# Patient Record
Sex: Male | Born: 1935 | Race: Black or African American | Hispanic: No | Marital: Married | Smoking: Former smoker
Health system: Southern US, Community
[De-identification: ages and names within clinical notes are randomized; demographics above are authoritative.]

## PROBLEM LIST (undated history)

## (undated) DIAGNOSIS — I10 Essential (primary) hypertension: Secondary | ICD-10-CM

## (undated) DIAGNOSIS — Z8719 Personal history of other diseases of the digestive system: Secondary | ICD-10-CM

## (undated) DIAGNOSIS — K219 Gastro-esophageal reflux disease without esophagitis: Secondary | ICD-10-CM

## (undated) DIAGNOSIS — F0390 Unspecified dementia without behavioral disturbance: Secondary | ICD-10-CM

## (undated) DIAGNOSIS — F0280 Dementia in other diseases classified elsewhere without behavioral disturbance: Secondary | ICD-10-CM

## (undated) DIAGNOSIS — E785 Hyperlipidemia, unspecified: Secondary | ICD-10-CM

## (undated) DIAGNOSIS — G309 Alzheimer's disease, unspecified: Secondary | ICD-10-CM

## (undated) DIAGNOSIS — I2510 Atherosclerotic heart disease of native coronary artery without angina pectoris: Secondary | ICD-10-CM

## (undated) HISTORY — DX: Hyperlipidemia, unspecified: E78.5

## (undated) HISTORY — DX: Personal history of other diseases of the digestive system: Z87.19

## (undated) HISTORY — PX: CHOLECYSTECTOMY: SHX55

## (undated) HISTORY — DX: Unspecified dementia, unspecified severity, without behavioral disturbance, psychotic disturbance, mood disturbance, and anxiety: F03.90

## (undated) HISTORY — PX: BACK SURGERY: SHX140

## (undated) HISTORY — DX: Essential (primary) hypertension: I10

## (undated) HISTORY — DX: Atherosclerotic heart disease of native coronary artery without angina pectoris: I25.10

## (undated) HISTORY — DX: Gastro-esophageal reflux disease without esophagitis: K21.9

---

## 1998-12-07 ENCOUNTER — Emergency Department (HOSPITAL_COMMUNITY): Admission: EM | Admit: 1998-12-07 | Discharge: 1998-12-07 | Payer: Self-pay | Admitting: Emergency Medicine

## 1998-12-08 ENCOUNTER — Encounter: Payer: Self-pay | Admitting: Emergency Medicine

## 1999-03-24 ENCOUNTER — Encounter: Payer: Self-pay | Admitting: General Surgery

## 1999-03-26 ENCOUNTER — Encounter: Payer: Self-pay | Admitting: General Surgery

## 1999-03-26 ENCOUNTER — Ambulatory Visit (HOSPITAL_COMMUNITY): Admission: RE | Admit: 1999-03-26 | Discharge: 1999-03-27 | Payer: Self-pay | Admitting: General Surgery

## 1999-12-11 ENCOUNTER — Ambulatory Visit (HOSPITAL_COMMUNITY): Admission: RE | Admit: 1999-12-11 | Discharge: 1999-12-11 | Payer: Self-pay | Admitting: Unknown Physician Specialty

## 2001-05-01 ENCOUNTER — Encounter: Admission: RE | Admit: 2001-05-01 | Discharge: 2001-07-30 | Payer: Self-pay | Admitting: Orthopedic Surgery

## 2001-05-09 ENCOUNTER — Emergency Department (HOSPITAL_COMMUNITY): Admission: EM | Admit: 2001-05-09 | Discharge: 2001-05-09 | Payer: Self-pay | Admitting: Emergency Medicine

## 2001-05-10 ENCOUNTER — Encounter: Payer: Self-pay | Admitting: Emergency Medicine

## 2001-05-10 ENCOUNTER — Ambulatory Visit (HOSPITAL_COMMUNITY): Admission: RE | Admit: 2001-05-10 | Discharge: 2001-05-10 | Payer: Self-pay | Admitting: Emergency Medicine

## 2001-05-22 ENCOUNTER — Encounter: Payer: Self-pay | Admitting: Neurosurgery

## 2001-05-23 ENCOUNTER — Inpatient Hospital Stay (HOSPITAL_COMMUNITY): Admission: RE | Admit: 2001-05-23 | Discharge: 2001-05-24 | Payer: Self-pay | Admitting: Neurosurgery

## 2001-05-23 ENCOUNTER — Encounter: Payer: Self-pay | Admitting: Neurosurgery

## 2002-01-15 ENCOUNTER — Ambulatory Visit (HOSPITAL_COMMUNITY): Admission: RE | Admit: 2002-01-15 | Discharge: 2002-01-15 | Payer: Self-pay | Admitting: Neurosurgery

## 2002-01-15 ENCOUNTER — Encounter: Payer: Self-pay | Admitting: Neurosurgery

## 2004-01-29 ENCOUNTER — Ambulatory Visit (HOSPITAL_COMMUNITY): Admission: RE | Admit: 2004-01-29 | Discharge: 2004-01-29 | Payer: Self-pay | Admitting: Internal Medicine

## 2004-12-17 ENCOUNTER — Encounter (INDEPENDENT_AMBULATORY_CARE_PROVIDER_SITE_OTHER): Payer: Self-pay | Admitting: Specialist

## 2004-12-17 ENCOUNTER — Ambulatory Visit (HOSPITAL_COMMUNITY): Admission: RE | Admit: 2004-12-17 | Discharge: 2004-12-17 | Payer: Self-pay | Admitting: Gastroenterology

## 2005-03-30 ENCOUNTER — Encounter: Admission: RE | Admit: 2005-03-30 | Discharge: 2005-03-30 | Payer: Self-pay | Admitting: Internal Medicine

## 2006-06-02 ENCOUNTER — Encounter: Admission: RE | Admit: 2006-06-02 | Discharge: 2006-06-02 | Payer: Self-pay | Admitting: Cardiovascular Disease

## 2006-06-06 ENCOUNTER — Encounter: Admission: RE | Admit: 2006-06-06 | Discharge: 2006-06-06 | Payer: Self-pay | Admitting: Cardiovascular Disease

## 2006-06-07 ENCOUNTER — Observation Stay (HOSPITAL_COMMUNITY): Admission: RE | Admit: 2006-06-07 | Discharge: 2006-06-08 | Payer: Self-pay | Admitting: Cardiovascular Disease

## 2006-07-29 ENCOUNTER — Emergency Department (HOSPITAL_COMMUNITY): Admission: EM | Admit: 2006-07-29 | Discharge: 2006-07-29 | Payer: Self-pay | Admitting: Emergency Medicine

## 2006-08-09 ENCOUNTER — Inpatient Hospital Stay (HOSPITAL_COMMUNITY): Admission: EM | Admit: 2006-08-09 | Discharge: 2006-08-11 | Payer: Self-pay | Admitting: Emergency Medicine

## 2008-08-13 ENCOUNTER — Ambulatory Visit: Payer: Self-pay | Admitting: Diagnostic Radiology

## 2008-08-13 ENCOUNTER — Inpatient Hospital Stay (HOSPITAL_COMMUNITY): Admission: EM | Admit: 2008-08-13 | Discharge: 2008-08-19 | Payer: Self-pay | Admitting: Internal Medicine

## 2008-08-13 ENCOUNTER — Encounter: Payer: Self-pay | Admitting: Emergency Medicine

## 2009-01-11 ENCOUNTER — Encounter: Payer: Self-pay | Admitting: Internal Medicine

## 2009-01-11 ENCOUNTER — Emergency Department (HOSPITAL_COMMUNITY): Admission: EM | Admit: 2009-01-11 | Discharge: 2009-01-11 | Payer: Self-pay | Admitting: Emergency Medicine

## 2009-02-25 ENCOUNTER — Encounter: Admission: RE | Admit: 2009-02-25 | Discharge: 2009-02-25 | Payer: Self-pay | Admitting: Gastroenterology

## 2009-06-05 ENCOUNTER — Encounter: Payer: Self-pay | Admitting: Internal Medicine

## 2009-06-09 ENCOUNTER — Ambulatory Visit: Payer: Self-pay | Admitting: Internal Medicine

## 2009-06-09 DIAGNOSIS — K219 Gastro-esophageal reflux disease without esophagitis: Secondary | ICD-10-CM

## 2009-06-09 DIAGNOSIS — I10 Essential (primary) hypertension: Secondary | ICD-10-CM | POA: Insufficient documentation

## 2009-06-09 DIAGNOSIS — E785 Hyperlipidemia, unspecified: Secondary | ICD-10-CM | POA: Insufficient documentation

## 2009-06-09 DIAGNOSIS — I2510 Atherosclerotic heart disease of native coronary artery without angina pectoris: Secondary | ICD-10-CM | POA: Insufficient documentation

## 2009-06-09 DIAGNOSIS — K922 Gastrointestinal hemorrhage, unspecified: Secondary | ICD-10-CM | POA: Insufficient documentation

## 2009-06-09 DIAGNOSIS — G4700 Insomnia, unspecified: Secondary | ICD-10-CM

## 2009-06-09 LAB — CONVERTED CEMR LAB
HCT: 46.3 % (ref 39.0–52.0)
Hemoglobin: 15.5 g/dL (ref 13.0–17.0)
MCHC: 33.5 g/dL (ref 30.0–36.0)
MCV: 89.9 fL (ref 78.0–100.0)
Platelets: 245 10*3/uL (ref 150–400)
RBC: 5.15 M/uL (ref 4.22–5.81)
RDW: 13.1 % (ref 11.5–15.5)
TSH: 1.949 microintl units/mL (ref 0.350–4.500)
Vitamin B-12: 292 pg/mL (ref 211–911)
WBC: 6.3 10*3/uL (ref 4.0–10.5)

## 2009-06-10 ENCOUNTER — Encounter: Payer: Self-pay | Admitting: Internal Medicine

## 2009-08-15 ENCOUNTER — Ambulatory Visit: Payer: Self-pay | Admitting: Internal Medicine

## 2009-08-15 DIAGNOSIS — M25569 Pain in unspecified knee: Secondary | ICD-10-CM

## 2009-12-02 ENCOUNTER — Telehealth: Payer: Self-pay | Admitting: Internal Medicine

## 2010-02-10 ENCOUNTER — Encounter: Payer: Self-pay | Admitting: Internal Medicine

## 2010-02-10 LAB — CONVERTED CEMR LAB: Vitamin B-12: 528 pg/mL (ref 211–911)

## 2010-02-16 ENCOUNTER — Ambulatory Visit: Payer: Self-pay | Admitting: Diagnostic Radiology

## 2010-02-16 ENCOUNTER — Ambulatory Visit: Payer: Self-pay | Admitting: Internal Medicine

## 2010-02-16 ENCOUNTER — Ambulatory Visit (HOSPITAL_BASED_OUTPATIENT_CLINIC_OR_DEPARTMENT_OTHER): Admission: RE | Admit: 2010-02-16 | Discharge: 2010-02-16 | Payer: Self-pay | Admitting: Internal Medicine

## 2010-02-16 ENCOUNTER — Telehealth: Payer: Self-pay | Admitting: Internal Medicine

## 2010-02-16 DIAGNOSIS — R0789 Other chest pain: Secondary | ICD-10-CM | POA: Insufficient documentation

## 2010-04-13 ENCOUNTER — Telehealth: Payer: Self-pay | Admitting: Internal Medicine

## 2010-04-20 ENCOUNTER — Ambulatory Visit: Payer: Self-pay | Admitting: Internal Medicine

## 2010-07-01 ENCOUNTER — Encounter: Payer: Self-pay | Admitting: Internal Medicine

## 2010-07-07 ENCOUNTER — Telehealth: Payer: Self-pay | Admitting: Internal Medicine

## 2010-07-16 ENCOUNTER — Ambulatory Visit
Admission: RE | Admit: 2010-07-16 | Discharge: 2010-07-16 | Payer: Self-pay | Source: Home / Self Care | Attending: Internal Medicine | Admitting: Internal Medicine

## 2010-07-16 DIAGNOSIS — F4320 Adjustment disorder, unspecified: Secondary | ICD-10-CM | POA: Insufficient documentation

## 2010-07-23 ENCOUNTER — Encounter: Payer: Self-pay | Admitting: Internal Medicine

## 2010-07-28 NOTE — Assessment & Plan Note (Signed)
Summary: 2 MONTH FU/DT   Vital Signs:  Patient profile:   77 year old male Height:      69 inches Weight:      151 pounds BMI:     22.38 O2 Sat:      98 % on Room air Temp:     98.0 degrees F oral Pulse rate:   68 / minute Pulse rhythm:   regular Resp:     18 per minute BP sitting:   110 / 60  (right arm) Cuff size:   regular  Vitals Entered By: Glendell Docker CMA (April 20, 2010 3:59 PM)  O2 Flow:  Room air CC: 2 month follow up Is Patient Diabetic? No Pain Assessment Patient in pain? no      Comments neck and bilateral knee discomfort   Primary Care Provider:  Dondra Spry DO  CC:  2 month follow up.  History of Present Illness: 77 y/o  AA male with dementia for f/u he has occ knee pain and neck stiffness no swelling or redness no injury or trauma  Preventive Screening-Counseling & Management  Alcohol-Tobacco     Smoking Status: quit  Allergies (verified): No Known Drug Allergies  Past History:  Past Medical History: Hx of GI Bleed - multiple distal dudenal and general diverticula Dementia Glaucoma   Coronary artery disease  GERD  Hyperlipidemia Hypertension  Family History: son was diagnosed with kidney cancer - surgery by Dr. 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   Lungs:  normal respiratory effort, normal breath sounds, no crackles, and no wheezes.   Heart:  normal rate, regular rhythm, no murmur, and no gallop.   Msk:  no joint swelling, no joint warmth, no redness over joints, and no joint instability.     Impression & Recommendations:  Problem # 1:  KNEE PAIN, RIGHT (ICD-719.46) avoid oral NSAIDs.  use voltaren gel as needed  Problem # 2:  GERD (ICD-530.81) Assessment: Unchanged  His updated medication list for this problem includes:    Omeprazole 20 Mg Cpdr (Omeprazole) ..... One by mouth  once daily 30 minutes before morning meal  Complete Medication List: 1)  Namenda 10 Mg Tabs (Memantine hcl) .... Take 1 tablet by mouth two times a day 2)  Aricept 10 Mg Tabs (Donepezil hcl) .... Take 1 tablet by mouth once a day 3)  Mirtazapine 15 Mg Tabs (Mirtazapine) .... One by mouth qhs 4)  Voltaren 1 % Gel (Diclofenac sodium) .... Apply three times a day 5)  Zyrtec Allergy 10 Mg Tabs (Cetirizine hcl) .... Take 1 tablet by mouth once a day 6)  Vitamin B-12 1000 Mcg Tabs (Cyanocobalamin) .... Take 1 tablet by mouth three times a day 7)  Omeprazole 20 Mg Cpdr (Omeprazole) .... One by mouth once daily 30 minutes before morning meal  Other Orders: Influenza Vaccine MCR ([address]109) Administration Flu vaccine - MCR (U0454)  Patient Instructions: 1)  Please schedule a follow-up appointment in 6 months. Prescriptions: OMEPRAZOLE 20 MG CPDR (OMEPRAZOLE) one by mouth once daily 30 minutes before morning meal  #30 x 5   Entered and Authorized by:   D. Thomos Lemons DO   Signed by:   D. Thomos Lemons DO on 04/20/2010   Method used:   [facility] [id number]       [address]       [place], [place]  [address]       Ph: [phone] or [phone]       Fax: [phone]   RxID:   [id number] VOLTAREN 1 % GEL (DICLOFENAC SODIUM) apply three times a day  #2 pk x 5   Entered and Authorized by:   D. Thomos Lemons DO   Signed by:   D. Thomos Lemons DO on 04/20/2010   Method used:   [facility]        [facility]  [address] [id number]       [address]       [place], [place]  [address]       Ph: [phone] or [phone]       Fax: [phone]   RxID:   [id number]    Orders Added: 1)  Influenza Vaccine MCR [00025] 2)  Administration Flu vaccine - MCR [G0008] 3)  Est. Patient Level III [96789]   Immunizations Administered:  Influenza Vaccine # 1:    Vaccine Type: Fluvax MCR    Site: left deltoid     Mfr: GlaxoSmithKline    Dose: 0.5 ml    Route: IM    Given by: Glendell Docker CMA    Exp. Date: 12/26/2010    Lot #: [id number]    VIS given: 01/20/10 version given April 20, 2010.  Flu Vaccine Consent Questions:    Do you have a history of severe allergic reactions to this vaccine? no    Any prior history of allergic reactions to egg and/or gelatin? no    Do you have a sensitivity to the preservative Thimersol? no    Do you have a past history of Guillan-Barre Syndrome? no    Do you currently have an acute febrile illness? no    Have you ever had a severe reaction to latex? no    Vaccine information given and explained to patient? yes   Immunizations Administered:  Influenza Vaccine # 1:    Vaccine Type: Fluvax MCR    Site: left deltoid    Mfr: GlaxoSmithKline    Dose: 0.5 ml    Route: IM    Given by: Glendell Docker CMA    Exp. Date: 12/26/2010    Lot #: [id number]    VIS given: 01/20/10 version given April 20, 2010.   Current Allergies (reviewed today): No known allergies

## 2010-07-28 NOTE — Progress Notes (Signed)
Summary: Status Update  Phone Note From Other Clinic Call back at [phone]   Caller: Nurse- Lyndee Leo [facility]  Call For: Dr Artist Pais Summary of Call: Misty Stanley with [facility] called and left voice message stating she was out performing a monthly visit with patient today, and reports his vitals to be blood pressure 110/60 02 sat 99% pulse 73, respiration 20 and wt 140 . she states patient was taking off of Plavix and Aspirin due to bleeding issues , he is taking monthly B12 injections. Misty Stanley reports that patient has had been complaining of chest but, denies radiation down arm are back/shoulder pain, however she does states that he a stabbing sensation across chest from time to time. She would like to know if patient could start back on a low dose enteric coated aspirin for anticoagulation therapy. If approved she asked that we contact patients daughter at [phone] or call the patient at home [phone]. FYI patient has a follow up appt 8/15 @ 10:30a Initial call taken by: Glendell Docker CMA,  December 02, 2009 12:13 PM  Follow-up for Phone Call        call returned to patients daughter Gershon Cull, she was advised per Dr Artist Pais instructions, she states she will have her brother call and schedule follow up for patient Follow-up by: Glendell Docker CMA,  December 02, 2009 4:20 PM    Additional Follow-up for Phone Call Additional follow up Details #2::    I suggest OV Follow-up by: D. Thomos Lemons DO,  December 02, 2009 3:13 PM

## 2010-07-28 NOTE — Miscellaneous (Signed)
Summary: Orders Update  Clinical Lists Changes  Orders: Added new Test order of T-Vitamin B12 ([id number]) - Signed 

## 2010-07-28 NOTE — Assessment & Plan Note (Signed)
Summary: 6 MONTH FOLLOW UP/MHF--rm 3   Vital Signs:  Patient profile:   78 year old male Height:      69 inches Weight:      149.50 pounds BMI:     22.16 O2 Sat:      99 % on Room air Temp:     98.2 degrees F oral Pulse rate:   95 / minute Pulse rhythm:   regular Resp:     16 per minute BP sitting:   118 / 68  (right arm) Cuff size:   regular  Vitals Entered By: Mervin Kung CMA Duncan Dull) (February 16, 2010 2:49 PM)  O2 Flow:  Room air CC: Room 3   6 month follow up. [facility] on Namenda[facility]pt [facility]   Primary Care Provider:  Dondra Spry DO  CC:  Room 3   6 month follow up. Needs [facility]ills on Namenda and Aricept and [facility]  History of Present Illness: 77 y/o AA male for f/u re:  dementia no sign change pt accompanied by supportive son  son notes pt complains of chest pain non exertional.  he c/o usually in evening he exercises daily and seems to tolerate well unclear if related but he occ wakes up with neck pain  borerline b12 level - he has been taking OTC b12 supplement.   now well within normal limits -528  Preventive Screening-Counseling & Management  Alcohol-Tobacco     Smoking Status: quit  EKG  Procedure date:  02/16/2010  Findings:      NSR 65 bpm with occasional PVCs.  Otherwise normal ECG  Allergies (verified): No Known Drug Allergies  Past History:  Past Medical History: Hx of GI Bleed - multiple distal dudenal and general diverticula Dementia Glaucoma Coronary artery disease  GERD  Hyperlipidemia Hypertension  Family History: son was diagnosed with kidney cancer - surgery by Dr. 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   Chest Wall:  no chest wall tenderness Lungs:  normal respiratory effort, normal breath sounds, no crackles, and no wheezes.   Heart:  normal rate,  regular rhythm, no murmur, and no gallop.   Abdomen:  soft, non-tender, normal bowel sounds, and no masses.   Extremities:  No lower extremity edema   Impression & Recommendations:  Problem # 1:  CHEST PAIN, ATYPICAL (ICD-786.59) 77 y/o with atypical c/p.  history unreliable due to dementia.  EKG - no acute changes.  CXR - no active dz. monitor for now.  Orders: T-2 View CXR, Same Day (71020.5TC)  Problem # 2:  DEMENTIA (ICD-294.8) Assessment: Unchanged still able to perform ADLs.  continue aricept and namenda  Problem # 3:  CORONARY ARTERY DISEASE (ICD-414.00) last cardiac cath 07/2006  CONCLUSION:  1. Normal left ventricular systolic function.  2. Widely patent stent in the right coronary artery with only minor      irregularities in other vessels.   Ventriculography.  Ventriculography in the RAO projection revealed      normal LV systolic function.  Ejection fraction of excess of 60%.      The end-diastolic pressure was 12. No mitral regurgitation was      seen.  Complete Medication List: 1)  Namenda 10 Mg Tabs (Memantine hcl) .... Take 1 tablet by mouth two times a day 2)  Aricept 10 Mg Tabs (Donepezil hcl) .Marland KitchenMarland KitchenMarland Kitchen  Take 1 tablet by mouth once a day 3)  Mirtazapine 15 Mg Tabs (Mirtazapine) .... One by mouth qhs 4)  Voltaren 1 % Gel (Diclofenac sodium) .... Apply three times a day 5)  Zyrtec Allergy 10 Mg Tabs (Cetirizine hcl) .... Take 1 tablet by mouth once a day 6)  Vitamin B-12 1000 Mcg Tabs (Cyanocobalamin) .... Take 1 tablet by mouth three times a day 7)  Omeprazole 20 Mg Cpdr (Omeprazole) .... One by mouth once daily 30 minutes before morning meal  Patient Instructions: 1)  Please schedule a follow-up appointment in 2 months. Prescriptions: MIRTAZAPINE 15 MG TABS (MIRTAZAPINE) one by mouth qhs  #30 Tablet x 5   Entered and Authorized by:   D. Thomos Lemons DO   Signed by:   D. Thomos Lemons DO on 02/16/2010   Method used:   [facility]  [id number]       [address]       [place], [place]  [address]       Ph: [phone] or [phone]       Fax: [phone]   RxID:   [id number] ARICEPT 10 MG TABS (DONEPEZIL HCL) Take 1 tablet by mouth once a day  #30 x 5   Entered and Authorized by:   D. Thomos Lemons DO   Signed by:   D. Thomos Lemons DO on 02/16/2010   Method used:   [facility]        [facility]  [address] [id number]       [address]       [place], [place]  [address]       Ph: [phone] or [phone]       Fax: [phone]   RxID:   [id number] NAMENDA 10 MG TABS (MEMANTINE HCL) Take 1 tablet by mouth two times a day  #60 x 5   Entered and Authorized by:   D. Thomos Lemons DO   Signed by:   D. Thomos Lemons DO on 02/16/2010   Method used:   [facility]        [facility]  [address]       [address]       [place], [place]  [address]       Ph: [phone] or [phone]       Fax: [phone]   RxID:   [id number] OMEPRAZOLE 20 MG CPDR (OMEPRAZOLE) one by mouth once daily 30 minutes before morning meal  #30 x 2   Entered and Authorized by:   D. Thomos Lemons DO   Signed by:   D. Thomos Lemons DO on 02/16/2010   Method used:   [facility]        [facility]  [address] [id number]       [address]       [place], [place]  [address]       Ph: [phone] or [phone]       Fax: [phone]   RxID:   [id number]   Current Allergies (reviewed today): No known allergies

## 2010-07-28 NOTE — Assessment & Plan Note (Signed)
Summary: 2 MONTH FOLLOW UP/MHF  rsc with pt daughter/mhf, resched- jr   Vital Signs:  Patient profile:   77 year old male Weight:      148.50 pounds BMI:     22.01 Temp:     97.6 degrees F oral Pulse rate:   60 / minute Pulse rhythm:   regular Resp:     18 per minute BP sitting:   114 / 60  (right arm) Cuff size:   regular  Vitals Entered By: Glendell Docker CMA (August 15, 2009 10:31 AM)  Primary Care Provider:  Dondra Spry DO  CC:  2 Month Follow up.  History of Present Illness: 2 Month Follow up   77 y/o AA for f/u sleep and appetite better with remeron.  no side effects notes  c/o right knee burning sensation throughout the day also occ bilateral arm pain. no hx of injury or trauma  memory loss stable - take namenda and aricept.  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   Eyes:  pupils equal, pupils round, and pupils reactive to light.  bilateral arcus senilus Neck:  supple, no masses, and no carotid bruits.   Lungs:  normal respiratory effort, normal breath sounds, no crackles, and no wheezes.   Heart:  normal rate, regular rhythm, no murmur, and no gallop.   Neurologic:  cranial nerves II-XII intact and gait normal.   Psych:  normally interactive and good eye contact.     Impression & Recommendations:  Problem # 1:  INSOMNIA, CHRONIC (ICD-307.42) Assessment Improved  appetite and sleep improved with remeron.  Maintain current medication regimen.  Orders: Prescription Created Electronically [phone])  Problem # 2:  DEMENTIA (ICD-294.8)  stable.  continue aricept and namenda.  consider  add axona RPR , TSH, normal.  B12 low normal.  pt advised to start B12 vitamin over the counter.  recheck b12 next OV.  consider check methymalonic acid he c/o knee pain and arm pain of unclear etiology.   check FTA antibody  Orders: Prescription Created Electronically [phone])  Problem # 3:  KNEE PAIN, RIGHT (ICD-719.46)  occ right knee pain.  probable osteoarthritis.  use voltaren gel as needed.  Orders: Prescription Created Electronically [phone]  Complete Medication List: 1)  Namenda 10 Mg Tabs (Memantine hcl) .... Take 1 tablet by mouth two times a day 2)  Aricept 10 Mg Tabs (Donepezil hcl) .... Take 1 tablet by mouth once a day 3)  Mirtazapine 15 Mg Tabs (Mirtazapine) .... One by mouth qhs 4)  Voltaren 1 % Gel (Diclofenac sodium) .... Apply three times a day  Patient Instructions: 1)  Please schedule a follow-up appointment in 6 months 2)  B 12 level :  294.8 3)  Please return for lab work one (1) week before your next appointment.  Prescriptions: MIRTAZAPINE 15 MG TABS (MIRTAZAPINE) one by mouth qhs  #30 x 5   Entered and Authorized by:   D. Thomos Lemons DO   Signed by:   D. Thomos Lemons DO on 08/15/2009   Method used:   [facility]        [facility]  [address] [phone][facility]ail[facility]  [address]       [place], [place]  [address]       Ph: [phone] or [phone]       Fax: [phone]   RxID:   [id number] ARICEPT 10 MG TABS (DONEPEZIL HCL) Take 1 tablet by mouth once a day  #30 x 5   Entered and Authorized by:   D. Thomos Lemons DO   Signed by:   D. Thomos Lemons DO on 08/15/2009   Method used:   [facility]        [facility]  [address] [id number]       [address]       [place], [place]  [address]       Ph: [phone] or [phone]       Fax: [phone]   RxID:   [id number] NAMENDA 10 MG TABS (MEMANTINE HCL) Take 1 tablet by mouth two times a day  #60 x 5   Entered and Authorized by:   D. Thomos Lemons DO    Signed by:   D. Thomos Lemons DO on 08/15/2009   Method used:   [facility]ically [facility]        [facility]  [address] [facility]       [address]       [place], [place]  [address]       Ph: [phone] or [phone]       Fax: [phone]   RxID:   [id number] VOLTAREN 1 % GEL (DICLOFENAC SODIUM) apply three times a day  #3 pk x 3   Entered and Authorized by:   D. Thomos Lemons DO   Signed by:   D. Thomos Lemons DO on 08/15/2009   Method used:   [facility]ically [facility]        [facility]  [address] [facility]       [address]       [place], [place]  [address]       Ph: [phone] or [phone]       Fax: [phone]   RxID:   [id number]   Current Allergies (reviewed today): No known allergies

## 2010-07-28 NOTE — Progress Notes (Signed)
Summary: Flu Vaccine  Phone Note From Other Clinic Call back at [phone]   Caller: Hart Rochester Summary of Call: Elliot Cousin form [facility] called and left voice message requesting order to provide patient flu vaccine. Initial call taken by: Glendell Docker CMA,  April 13, 2010 9:09 AM  Follow-up for Phone Call        ok to send order Follow-up by: D. Thomos Lemons DO,  April 13, 2010 9:20 AM  Additional Follow-up for Phone Call Additional follow up Details #1::        call placed to Elliot Cousin at [phone], no answer. A detailed voice message was left informing her Fluc shot was okay to provide patient. Message was left for her to call if nay questions Additional Follow-up by: Glendell Docker CMA,  April 13, 2010 3:39 PM    Additional Follow-up for Phone Call Additional follow up Details #2::    Order has been faxed to Elliot Cousin with [facility] Follow-up by: Glendell Docker CMA,  April 13, 2010 4:48 PM

## 2010-07-28 NOTE — Progress Notes (Signed)
Summary: CXR Results  Phone Note Outgoing Call   Summary of Call: call pt - CXR normal Initial call taken by: D. Thomos Lemons DO,  February 16, 2010 4:54 PM  Follow-up for Phone Call        call placed to [facility] at [phone], no answer. A detailed voice message was left informing per Dr Artist Pais instructions Follow-up by: Glendell Docker CMA,  February 17, 2010 9:12 AM

## 2010-07-30 NOTE — Progress Notes (Signed)
Summary: Dementia  Phone Note Call from Patient Call back at [phone]   Caller: Son-Nathaniel Stone Call For: D. Thomos Lemons DO Summary of Call: patients son called and left voice message stating he feels patients dementia is worse. His wife passed away in [place], and his son is concerned that patient is very depressed. He would like to know what Dr Artist Pais advises Initial call taken by: Glendell Docker CMA,  July 07, 2010 1:11 PM  Follow-up for Phone Call        I suggest OV.  we can discuss treatment options Follow-up by: D. Thomos Lemons DO,  July 07, 2010 5:15 PM  Additional Follow-up for Phone Call Additional follow up Details #1::        call returned to patient son Nathaniel Stone  at [phone], no answer. A detalied voice message was left informing per Dr Artist Pais instructions. He has been advised to schedule appointment with Dr Artist Pais Additional Follow-up by: Glendell Docker CMA,  July 07, 2010 5:27 PM

## 2010-07-30 NOTE — Letter (Signed)
Summary: [facility]  [facility]   Imported By: Lanelle Bal 07/14/2010 10:10:17  _____________________________________________________________________  External Attachment:    Type:   Image     Comment:   External Document

## 2010-08-05 NOTE — Assessment & Plan Note (Signed)
Summary: depression? / tf,cma   Vital Signs:  Patient profile:   77 year old male Height:      69 inches Weight:      145 pounds BMI:     21.49 O2 Sat:      99 % on Room air Temp:     98.5 degrees F oral Pulse rate:   75 / minute Resp:     20 per minute BP sitting:   100 / 60  (right arm) Cuff size:   regular  Vitals Entered By: Glendell Docker CMA (July 16, 2010 4:30 PM)  O2 Flow:  Room air CC: dicuss depression Is Patient Diabetic? No Pain Assessment Patient in pain? no      Comments patients son states , symptoms have worsen,  wants to stay in bed, decrease in appetite, c/o neck pain   Primary Care Provider:  Dondra Spry DO  CC:  dicuss depression.  History of Present Illness: married 81 years wife passed away 2 months ago she suffered fall and fractured her pelvis  since wife's death son reports pt more withdrawn poor appetite and wt loss    Preventive Screening-Counseling & Management  Alcohol-Tobacco     Smoking Status: quit  Allergies (verified): No Known Drug Allergies  Past History:  Past Medical History: Hx of GI Bleed - multiple distal dudenal and general diverticula Dementia Glaucoma   Coronary artery disease   GERD  Hyperlipidemia Hypertension  Family History: son was diagnosed with kidney cancer - surgery by Dr. 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   Lungs:  normal respiratory effort, normal breath sounds, no crackles, and no wheezes.   Heart:  normal rate, regular rhythm, no murmur, and no gallop.   Neurologic:  cranial nerves II-XII intact.   Psych:  normally interactive and flat affect.     Impression & Recommendations:  Problem # 1:  ADJUSTMENT DISORDER WITHOUT DEPRESSED MOOD (ICD-309.9) depressed mood assoc with grief rxn from wife's death use sertraline as  directed son will work on getting pt involved in adult day program Patient's son advised to call office if symptoms persist or worsen.  Complete Medication List: 1)  Namenda 10 Mg Tabs (Memantine hcl) .... Take 1 tablet by mouth two times a day 2)  Aricept 10 Mg Tabs (Donepezil hcl) .... Take 1 tablet by mouth once a day 3)  Mirtazapine 15 Mg Tabs (Mirtazapine) .... One by mouth qhs 4)  Voltaren 1 % Gel (Diclofenac sodium) .... Apply three times a day 5)  Zyrtec Allergy 10 Mg Tabs (Cetirizine hcl) .... Take 1 tablet by mouth once a day 6)  Vitamin B-12 1000 Mcg Tabs (Cyanocobalamin) .... Take 1 tablet by mouth three times a day 7)  Omeprazole 20 Mg Cpdr (Omeprazole) .... One by mouth once daily 30 minutes before morning meal 8)  Sertraline Hcl 50 Mg Tabs (Sertraline hcl) .... 1/2 by mouth once daily x 7 days,  then one by mouth once daily  Patient Instructions: 1)  Please schedule a follow-up appointment in 1 month. Prescriptions: MIRTAZAPINE 15 MG TABS (MIRTAZAPINE) one by mouth qhs  #30 Tablet x 5   Entered and Authorized by:   D. Thomos Lemons DO   Signed by:   D. Thomos Lemons DO on 07/16/2010   Method used:  [facility]  [address] [id number]       [address]       [place], [place]  [address]       Ph: [phone] or [phone]       Fax: [phone]   RxID:   [id number] ARICEPT 10 MG TABS (DONEPEZIL HCL) Take 1 tablet by mouth once a day  #30 x 5   Entered and Authorized by:   D. Thomos Lemons DO   Signed by:   D. Thomos Lemons DO on 07/16/2010   Method used:   [facility]        [facility]  [address] [facility]       [address]       [place], [place]  [address]       Ph: [phone] or [phone]       Fax: [phone]   RxID:   [id number] NAMENDA 10 MG TABS (MEMANTINE HCL) Take 1 tablet by mouth two times a day  #60 x 5   Entered and Authorized by:   D. Thomos Lemons DO   Signed by:   D. Thomos Lemons DO on 07/16/2010   Method used:   [facility]        [facility]  [address] [facility]       [address]       [place], [place]  [address]       Ph: [phone] or [phone]       Fax: [phone]   RxID:   [id number] SERTRALINE HCL 50 MG TABS (SERTRALINE HCL) 1/2 by mouth once daily x 7 days,  then one by mouth once daily  #30 x 1   Entered and Authorized by:   D. Thomos Lemons DO   Signed by:   D. Thomos Lemons DO on 07/16/2010   Method used:   [facility]        [facility]  [address] [facility]       [address]       [place], [place]  [address]       Ph: [phone] or [phone]       Fax: [phone]   RxID:   [id number]    Orders Added: 1)  Est. Patient Level III [97989]    Current Allergies (reviewed today): No known allergies

## 2010-08-06 ENCOUNTER — Encounter: Payer: Self-pay | Admitting: Internal Medicine

## 2010-08-13 NOTE — Miscellaneous (Signed)
Summary: [facility] Home Visit  [facility] Home Visit   Imported By: Maryln Gottron 08/06/2010 09:27:03  _____________________________________________________________________  External Attachment:    Type:   Image     Comment:   External Document

## 2010-08-18 ENCOUNTER — Ambulatory Visit: Payer: Self-pay | Admitting: Internal Medicine

## 2010-08-20 ENCOUNTER — Encounter: Payer: Self-pay | Admitting: Internal Medicine

## 2010-08-20 ENCOUNTER — Ambulatory Visit (INDEPENDENT_AMBULATORY_CARE_PROVIDER_SITE_OTHER): Payer: MEDICARE | Admitting: Internal Medicine

## 2010-08-20 DIAGNOSIS — F4320 Adjustment disorder, unspecified: Secondary | ICD-10-CM

## 2010-08-20 DIAGNOSIS — K4090 Unilateral inguinal hernia, without obstruction or gangrene, not specified as recurrent: Secondary | ICD-10-CM | POA: Insufficient documentation

## 2010-08-20 DIAGNOSIS — IMO0002 Reserved for concepts with insufficient information to code with codable children: Secondary | ICD-10-CM | POA: Insufficient documentation

## 2010-09-08 NOTE — Assessment & Plan Note (Signed)
Summary: 1 month follow up/hea--rm 2   Vital Signs:  Patient profile:   77 year old male Height:      69 inches Weight:      143 pounds BMI:     21.19 O2 Sat:      98 % on Room air Temp:     98.1 degrees F oral Pulse rate:   76 / minute Pulse rhythm:   regular Resp:     18 per minute BP sitting:   100 / 70  (right arm) Cuff size:   regular  Vitals Entered By: Mervin Kung CMA Duncan Dull) (August 20, 2010 9:25 AM)  O2 Flow:  Room air CC: Pt here for 1 month follow up. States he has intermittent pain in his groin since yesterday. Is Patient Diabetic? No   Primary Care Provider:  Dondra Spry DO  CC:  Pt here for 1 month follow up. States he has intermittent pain in his groin since yesterday.Marland Kitchen  History of Present Illness: 77 y/o male for f/u re:   grief rxn no sign change since starting sertraline son has noticed increased somnolence  he c/o intermittent groin pain no dysuria no fever  Preventive Screening-Counseling & Management  Alcohol-Tobacco     Alcohol drinks/day: 0     Alcohol Counseling: not indicated; patient does not drink     Smoking Status: quit     Packs/Day: 2.0     Tobacco Counseling: not to resume use of tobacco products  Allergies (verified): No Known Drug Allergies  Past History:  Past Medical History: Hx of GI Bleed - multiple distal dudenal and general diverticula Dementia Glaucoma    Coronary artery disease   GERD  Hyperlipidemia Hypertension  Family History: son was diagnosed with kidney cancer - surgery by Dr. Laverle Patter      Social History: Occupation: Woodlayer Married 59 years  2 sons 43, 54    2 daughter 18, 27   Retired  Physical Exam  General:  alert, well-developed, and well-nourished.   Lungs:  normal respiratory effort, normal breath sounds, no crackles, and no wheezes.   Heart:  normal rate, regular rhythm, no murmur, and no gallop.   Abdomen:  left ing hernia Psych:  normally interactive, good eye contact, not  anxious appearing, and not depressed appearing.     Impression & Recommendations:  Problem # 1:  GROIN STRAIN, RIGHT (ICD-848.8) use warm compress. Patient advised to call office if symptoms persist or worsen.  Problem # 2:  INGUINAL HERNIA, LEFT (ICD-550.90) monitor for now discussed red flag symptoms  Problem # 3:  ADJUSTMENT DISORDER WITHOUT DEPRESSED MOOD (ICD-309.9) Assessment: Unchanged increased fatigue with sertraline DC sertraline replace with citalopram  Complete Medication List: 1)  Namenda 10 Mg Tabs (Memantine hcl) .... Take 1 tablet by mouth two times a day 2)  Aricept 10 Mg Tabs (Donepezil hcl) .... Take 1 tablet by mouth once a day 3)  Mirtazapine 15 Mg Tabs (Mirtazapine) .... One by mouth qhs 4)  Voltaren 1 % Gel (Diclofenac sodium) .... Apply three times a day 5)  Zyrtec Allergy 10 Mg Tabs (Cetirizine hcl) .... Take 1 tablet by mouth once a day 6)  Vitamin B-12 1000 Mcg Tabs (Cyanocobalamin) .... Take 1 tablet by mouth three times a day 7)  Omeprazole 20 Mg Cpdr (Omeprazole) .... One by mouth once daily 30 minutes before morning meal 8)  Tylenol Pm Extra Strength 500-25 Mg Tabs (Diphenhydramine-apap (sleep)) .... Take 1 tab by mouth at  bedtime. 9)  Citalopram Hydrobromide 10 Mg Tabs (Citalopram hydrobromide) .... One by mouth once daily  Patient Instructions: 1)  Stop taking sertraline 2)  Call our office if right groin pain gets worse 3)  Please schedule a follow-up appointment in 2 months. Prescriptions: CITALOPRAM HYDROBROMIDE 10 MG TABS (CITALOPRAM HYDROBROMIDE) one by mouth once daily  #30 x 2   Entered and Authorized by:   D. Thomos Lemons DO   Signed by:   D. Thomos Lemons DO on 08/20/2010   Method used:   Electronically to        [facility]  [address] [id number] ([facility])       [address]       [place], [place]  [address]       Ph: [phone] or [phone]       Fax: [phone]   RxID:   [id number]    Orders Added: 1)  Est.  Patient Level III [36644]    Current Allergies (reviewed today): No known allergies

## 2010-09-11 ENCOUNTER — Telehealth: Payer: Self-pay | Admitting: Internal Medicine

## 2010-09-15 NOTE — Progress Notes (Signed)
Summary: UPDATE FROM [facility]   Phone Note From Other Clinic   Caller: [facility] Call For: Nathaniel Stone  Summary of Call: VITALS ARE 100 OVER 52 ON RIGHT ARM AND LEFT ARM 102/52 99% SAT ON ROOM AIR 68 HEART RATE BREATHING WELL.  WEIGHS 143 NO PROBLEMS NO FALLS  Initial call taken by: Roselle Locus,  September 11, 2010 11:47 AM  Follow-up for Phone Call        noted Follow-up by: D. Thomos Lemons DO,  September 11, 2010 1:26 PM

## 2010-09-23 ENCOUNTER — Telehealth: Payer: Self-pay | Admitting: Internal Medicine

## 2010-09-23 DIAGNOSIS — F068 Other specified mental disorders due to known physiological condition: Secondary | ICD-10-CM

## 2010-09-23 NOTE — Telephone Encounter (Signed)
Refill- mirtazapine 15mg  tablet. One by mouth at bedtime. Qty 30. Last fill 2.25.12

## 2010-09-24 MED ORDER — MIRTAZAPINE 15 MG PO TABS: 15.0000 mg | ORAL_TABLET | Freq: Every day | ORAL | Status: DC

## 2010-09-24 NOTE — Telephone Encounter (Signed)
rx refill sent to pharmacy 

## 2010-09-24 NOTE — Telephone Encounter (Signed)
Ok to refill x 3 

## 2010-10-04 LAB — DIFFERENTIAL
Basophils Absolute: 0 10*3/uL (ref 0.0–0.1)
Basophils Relative: 0 % (ref 0–1)
Eosinophils Absolute: 0 10*3/uL (ref 0.0–0.7)
Eosinophils Relative: 0 % (ref 0–5)
Lymphocytes Relative: 8 % — ABNORMAL LOW (ref 12–46)
Lymphs Abs: 0.7 10*3/uL (ref 0.7–4.0)
Monocytes Absolute: 0.5 10*3/uL (ref 0.1–1.0)
Monocytes Relative: 5 % (ref 3–12)
Neutro Abs: 7.4 10*3/uL (ref 1.7–7.7)
Neutrophils Relative %: 87 % — ABNORMAL HIGH (ref 43–77)

## 2010-10-04 LAB — URINALYSIS, ROUTINE W REFLEX MICROSCOPIC
Glucose, UA: NEGATIVE mg/dL
Hgb urine dipstick: NEGATIVE
Ketones, ur: 40 mg/dL — AB
Nitrite: NEGATIVE
Protein, ur: NEGATIVE mg/dL
Specific Gravity, Urine: 1.034 — ABNORMAL HIGH (ref 1.005–1.030)
Urobilinogen, UA: 1 mg/dL (ref 0.0–1.0)
pH: 5.5 (ref 5.0–8.0)

## 2010-10-04 LAB — COMPREHENSIVE METABOLIC PANEL
ALT: 9 U/L (ref 0–53)
AST: 16 U/L (ref 0–37)
Albumin: 3.8 g/dL (ref 3.5–5.2)
Alkaline Phosphatase: 87 U/L (ref 39–117)
BUN: 11 mg/dL (ref 6–23)
CO2: 25 mEq/L (ref 19–32)
Calcium: 9.8 mg/dL (ref 8.4–10.5)
Chloride: 107 mEq/L (ref 96–112)
Creatinine, Ser: 0.94 mg/dL (ref 0.4–1.5)
GFR calc Af Amer: >60 mL/min (ref >60)
GFR calc non Af Amer: >60 mL/min (ref >60)
Glucose, Bld: 103 mg/dL — ABNORMAL HIGH (ref 70–99)
Potassium: 3.4 mEq/L — ABNORMAL LOW (ref 3.5–5.1)
Sodium: 141 mEq/L (ref 135–145)
Total Bilirubin: 1 mg/dL (ref 0.3–1.2)
Total Protein: 7 g/dL (ref 6.0–8.3)

## 2010-10-04 LAB — CBC
HCT: 48.2 % (ref 39.0–52.0)
Hemoglobin: 16.1 g/dL (ref 13.0–17.0)
MCHC: 33.3 g/dL (ref 30.0–36.0)
MCV: 89.2 fL (ref 78.0–100.0)
Platelets: 231 10*3/uL (ref 150–400)
RBC: 5.41 MIL/uL (ref 4.22–5.81)
RDW: 14.3 % (ref 11.5–15.5)
WBC: 8.6 10*3/uL (ref 4.0–10.5)

## 2010-10-04 LAB — LIPASE, BLOOD: Lipase: 23 U/L (ref 11–59)

## 2010-10-12 ENCOUNTER — Ambulatory Visit: Payer: Self-pay | Admitting: Internal Medicine

## 2010-10-12 ENCOUNTER — Telehealth: Payer: Self-pay | Admitting: Internal Medicine

## 2010-10-12 DIAGNOSIS — F068 Other specified mental disorders due to known physiological condition: Secondary | ICD-10-CM

## 2010-10-12 NOTE — Telephone Encounter (Signed)
Refill- donepezil hcl 10mg  tablet. Take 1 tablet by mouth once a day. Qty 30. Last fill 3.7.12  Refill- namenda 10mg  tablet. Take 1 tablet by mouth two times a day. Qty 60. Last fill 3.7.12

## 2010-10-13 LAB — COMPREHENSIVE METABOLIC PANEL
ALT: 9 U/L (ref 0–53)
AST: 17 U/L (ref 0–37)
Albumin: 3.6 g/dL (ref 3.5–5.2)
Alkaline Phosphatase: 75 U/L (ref 39–117)
BUN: 28 mg/dL — ABNORMAL HIGH (ref 6–23)
CO2: 29 mEq/L (ref 19–32)
Calcium: 9.5 mg/dL (ref 8.4–10.5)
Chloride: 106 mEq/L (ref 96–112)
Creatinine, Ser: 1.1 mg/dL (ref 0.4–1.5)
GFR calc Af Amer: >60 mL/min (ref >60)
GFR calc non Af Amer: >60 mL/min (ref >60)
Glucose, Bld: 117 mg/dL — ABNORMAL HIGH (ref 70–99)
Potassium: 4.2 mEq/L (ref 3.5–5.1)
Sodium: 142 mEq/L (ref 135–145)
Total Bilirubin: 0.4 mg/dL (ref 0.3–1.2)
Total Protein: 6.7 g/dL (ref 6.0–8.3)

## 2010-10-13 LAB — CBC
HCT: 26.7 % — ABNORMAL LOW (ref 39.0–52.0)
HCT: 26.8 % — ABNORMAL LOW (ref 39.0–52.0)
HCT: 40 % (ref 39.0–52.0)
Hemoglobin: 10.3 g/dL — ABNORMAL LOW (ref 13.0–17.0)
Hemoglobin: 13.8 g/dL (ref 13.0–17.0)
Hemoglobin: 8.5 g/dL — ABNORMAL LOW (ref 13.0–17.0)
Hemoglobin: 8.8 g/dL — ABNORMAL LOW (ref 13.0–17.0)
Hemoglobin: 9 g/dL — ABNORMAL LOW (ref 13.0–17.0)
Hemoglobin: 9.1 g/dL — ABNORMAL LOW (ref 13.0–17.0)
MCHC: 32.6 g/dL (ref 30.0–36.0)
MCHC: 32.8 g/dL (ref 30.0–36.0)
MCHC: 33 g/dL (ref 30.0–36.0)
MCHC: 33.1 g/dL (ref 30.0–36.0)
MCHC: 33.4 g/dL (ref 30.0–36.0)
MCHC: 33.7 g/dL (ref 30.0–36.0)
MCHC: 34.5 g/dL (ref 30.0–36.0)
MCV: 89.6 fL (ref 78.0–100.0)
MCV: 90.4 fL (ref 78.0–100.0)
MCV: 90.6 fL (ref 78.0–100.0)
MCV: 91.3 fL (ref 78.0–100.0)
MCV: 91.4 fL (ref 78.0–100.0)
MCV: 91.6 fL (ref 78.0–100.0)
Platelets: 249 10*3/uL (ref 150–400)
Platelets: 254 10*3/uL (ref 150–400)
RBC: 2.85 MIL/uL — ABNORMAL LOW (ref 4.22–5.81)
RBC: 2.89 MIL/uL — ABNORMAL LOW (ref 4.22–5.81)
RBC: 2.96 MIL/uL — ABNORMAL LOW (ref 4.22–5.81)
RBC: 4.07 MIL/uL — ABNORMAL LOW (ref 4.22–5.81)
RBC: 4.46 MIL/uL (ref 4.22–5.81)
RDW: 14.3 % (ref 11.5–15.5)
RDW: 14.6 % (ref 11.5–15.5)
RDW: 14.7 % (ref 11.5–15.5)
RDW: 14.8 % (ref 11.5–15.5)
RDW: 14.8 % (ref 11.5–15.5)
WBC: 12.9 10*3/uL — ABNORMAL HIGH (ref 4.0–10.5)
WBC: 4.8 10*3/uL (ref 4.0–10.5)
WBC: 5.5 10*3/uL (ref 4.0–10.5)
WBC: 7.2 10*3/uL (ref 4.0–10.5)

## 2010-10-13 LAB — BASIC METABOLIC PANEL
CO2: 24 mEq/L (ref 19–32)
CO2: 25 mEq/L (ref 19–32)
CO2: 25 mEq/L (ref 19–32)
CO2: 26 mEq/L (ref 19–32)
Calcium: 7.9 mg/dL — ABNORMAL LOW (ref 8.4–10.5)
Calcium: 8 mg/dL — ABNORMAL LOW (ref 8.4–10.5)
Calcium: 8.5 mg/dL (ref 8.4–10.5)
Calcium: 8.6 mg/dL (ref 8.4–10.5)
Chloride: 110 mEq/L (ref 96–112)
Chloride: 112 mEq/L (ref 96–112)
Creatinine, Ser: 0.71 mg/dL (ref 0.4–1.5)
Creatinine, Ser: 0.82 mg/dL (ref 0.4–1.5)
Creatinine, Ser: 0.87 mg/dL (ref 0.4–1.5)
GFR calc Af Amer: >60 mL/min (ref >60)
GFR calc Af Amer: >60 mL/min (ref >60)
Glucose, Bld: 82 mg/dL (ref 70–99)
Glucose, Bld: 86 mg/dL (ref 70–99)
Potassium: 3.3 mEq/L — ABNORMAL LOW (ref 3.5–5.1)
Sodium: 140 mEq/L (ref 135–145)
Sodium: 141 mEq/L (ref 135–145)
Sodium: 141 mEq/L (ref 135–145)

## 2010-10-13 LAB — VITAMIN B12: Vitamin B-12: 349 pg/mL (ref 211–911)

## 2010-10-13 LAB — HEMOGLOBIN AND HEMATOCRIT, BLOOD
HCT: 28.1 % — ABNORMAL LOW (ref 39.0–52.0)
HCT: 29.7 % — ABNORMAL LOW (ref 39.0–52.0)
HCT: 30 % — ABNORMAL LOW (ref 39.0–52.0)
HCT: 31.1 % — ABNORMAL LOW (ref 39.0–52.0)
HCT: 33.5 % — ABNORMAL LOW (ref 39.0–52.0)
Hemoglobin: 10.2 g/dL — ABNORMAL LOW (ref 13.0–17.0)
Hemoglobin: 11.1 g/dL — ABNORMAL LOW (ref 13.0–17.0)
Hemoglobin: 9.1 g/dL — ABNORMAL LOW (ref 13.0–17.0)

## 2010-10-13 LAB — RETICULOCYTES
RBC.: 3.18 MIL/uL — ABNORMAL LOW (ref 4.22–5.81)
Retic Count, Absolute: 66.8 10*3/uL (ref 19.0–186.0)
Retic Ct Pct: 2.1 % (ref 0.4–3.1)

## 2010-10-13 LAB — CROSSMATCH
ABO/RH(D): O POS
Antibody Screen: NEGATIVE

## 2010-10-13 LAB — FOLATE: Folate: >20 ng/mL

## 2010-10-13 LAB — LIPASE, BLOOD: Lipase: 152 U/L (ref 23–300)

## 2010-10-13 LAB — PHOSPHORUS: Phosphorus: 2.6 mg/dL (ref 2.3–4.6)

## 2010-10-13 LAB — PROTIME-INR: Prothrombin Time: 14.1 seconds (ref 11.6–15.2)

## 2010-10-13 LAB — DIFFERENTIAL
Basophils Absolute: 0 10*3/uL (ref 0.0–0.1)
Eosinophils Relative: 0 % (ref 0–5)
Lymphocytes Relative: 9 % — ABNORMAL LOW (ref 12–46)
Neutro Abs: 11 10*3/uL — ABNORMAL HIGH (ref 1.7–7.7)
Neutrophils Relative %: 85 % — ABNORMAL HIGH (ref 43–77)

## 2010-10-13 LAB — IRON AND TIBC: Saturation Ratios: 12 % — ABNORMAL LOW (ref 20–55)

## 2010-10-13 LAB — POTASSIUM: Potassium: 3.3 mEq/L — ABNORMAL LOW (ref 3.5–5.1)

## 2010-10-13 LAB — MAGNESIUM: Magnesium: 2 mg/dL (ref 1.5–2.5)

## 2010-10-13 LAB — IRON: Iron: 73 ug/dL (ref 42–135)

## 2010-10-13 MED ORDER — MEMANTINE HCL 10 MG PO TABS: 10.0000 mg | ORAL_TABLET | Freq: Two times a day (BID) | ORAL | Status: DC

## 2010-10-13 MED ORDER — DONEPEZIL HCL 10 MG PO TABS: 10.0000 mg | ORAL_TABLET | Freq: Every day | ORAL | Status: DC

## 2010-10-13 NOTE — Telephone Encounter (Signed)
Medication refills sent to pharmacy for Aricept and Namenda

## 2010-10-21 ENCOUNTER — Ambulatory Visit: Payer: MEDICARE | Admitting: Internal Medicine

## 2010-10-26 ENCOUNTER — Ambulatory Visit: Payer: MEDICARE | Admitting: Internal Medicine

## 2010-10-27 ENCOUNTER — Ambulatory Visit (INDEPENDENT_AMBULATORY_CARE_PROVIDER_SITE_OTHER): Payer: MEDICARE | Admitting: Internal Medicine

## 2010-10-27 ENCOUNTER — Encounter: Payer: Self-pay | Admitting: Internal Medicine

## 2010-10-27 VITALS — BP 100/60 | HR 56 | Temp 97.7°F | Resp 18 | Wt 141.0 lb

## 2010-10-27 DIAGNOSIS — F4320 Adjustment disorder, unspecified: Secondary | ICD-10-CM

## 2010-10-27 DIAGNOSIS — F068 Other specified mental disorders due to known physiological condition: Secondary | ICD-10-CM

## 2010-10-27 MED ORDER — FLUTICASONE PROPIONATE 50 MCG/ACT NA SUSP: 2.0000 | Freq: Every day | NASAL | Status: DC

## 2010-10-27 MED ORDER — MEMANTINE HCL 10 MG PO TABS: 5.0000 mg | ORAL_TABLET | Freq: Two times a day (BID) | ORAL | Status: DC

## 2010-10-27 MED ORDER — MIRTAZAPINE 15 MG PO TABS: 7.5000 mg | ORAL_TABLET | Freq: Every day | ORAL | Status: DC

## 2010-10-27 NOTE — Progress Notes (Signed)
  Subjective:    Patient ID: Nathaniel Stone, male    DOB: 1929/02/21, 77 y.o.   MRN: [id number]  HPI  77 y/o AA male for r/u re:  Grief rxn / adjustment d/o.  Son states pt is about the same.  Family is trying to encourage pt to participate and adult day program.   Review of Systems Slight decrease in weight  Past Medical History  Diagnosis Date  . GERD (gastroesophageal reflux disease)   . Hypertension   . History of GI bleed     multiple distal duodenal and general diverticula  . Dementia   . Glaucoma   . CAD (coronary artery disease)   . Hyperlipidemia     History   Social History  . Marital Status: Married    Spouse Name: N/A    Number of Children: 4  . Years of Education: N/A   Occupational History  . Retired Corporate investment banker    Social History Main Topics  . Smoking status: Former Games developer  . Smokeless tobacco: Not on file  . Alcohol Use: Not on file  . Drug Use: Not on file  . Sexually Active: Not on file   Other Topics Concern  . Not on file   Social History Narrative  . No narrative on file    No past surgical history on file.  Family History  Problem Relation Age of Onset  . Cancer Son     kidney    No Known Allergies  Current Outpatient Prescriptions on File Prior to Visit  Medication Sig Dispense Refill  . cetirizine (ZYRTEC) 10 MG tablet Take 10 mg by mouth daily.        . diclofenac sodium (VOLTAREN) 1 % GEL Apply topically. Apply 3 times a day.       . donepezil (ARICEPT) 10 MG tablet Take 1 tablet (10 mg total) by mouth at bedtime.  30 tablet  5  . vitamin B-12 (CYANOCOBALAMIN) 1000 MCG tablet Take 1,000 mcg by mouth 3 (three) times daily.          BP 100/60  Pulse 56  Temp(Src) 97.7 F (36.5 C) (Oral)  Resp 18  Wt 141 lb (63.957 kg)  SpO2 98%       Objective:   Physical Exam  Constitutional: He appears well-developed and well-nourished.  Cardiovascular: Normal rate, regular rhythm and normal heart sounds.   Pulmonary/Chest: No  respiratory distress. He has no wheezes.  Skin: Skin is warm and dry.  Psychiatric: He has a normal mood and affect.          Assessment & Plan:

## 2010-11-10 NOTE — Op Note (Signed)
NAMEARGELIO, Nathaniel Stone                ACCOUNT NO.:  [id number]   MEDICAL RECORD NO.:  [id number]          PATIENT TYPE:  INP   LOCATION:  1440                         FACILITY:  [facility]   PHYSICIAN:  John C. Madilyn Fireman, M.D.    DATE OF BIRTH:  March 08, 1929   DATE OF PROCEDURE:  08/16/2008  DATE OF DISCHARGE:                               OPERATIVE REPORT   SMALL-BOWEL CAPSULE ENDOSCOPY   INDICATIONS FOR PROCEDURE:  GI bleeding of obscure origin.   FINDINGS:  The esophagus and stomach appeared normal.  There was  unusually long gastric passage time with first duodenal image at 4 hours  and 2 minutes.  There were areas of inflammation on the ridges of the  valvulae of the distal duodenum and presumably into the jejunum, noted  from approximately 4 hours and 3 minutes to 4 hours and 12 minutes.  There were some areas of shallow ulceration of which the images were  saved, but there were no stigmata of hemorrhage, no definite mass was  seen, and no areas strongly suspicious for malignancy, although the  appearance of the presumed inflammation was somewhat diffuse and  scattered.   At the 4-hour and 25-minute mark there was an abrupt transition to what  appeared to be colonic-like mucosa with some apparent fibrosis, possibly  suggestive of an ileocolonic surgical anastomosis.  From the 4-hour 25-  minute point to the 6-hour 20-minute point the capsule at several points  alternated between visualizing apparent small bowel mucosa and this more  colonic-like mucosa.  The anatomy was thus confusing.  It is possible  that what was interpreted as colonic mucosa could possibly have  represented large small-bowel diverticula but this could not be  determined.  From the 6-hour 20-minute mark to the 8-hour mark there  were no more areas of small intestinal mucosa and presumably the capsule  remained in the cecum during this time.  There were several images where  I thought I could clearly see an ileocolonic  surgical anastomosis,  mainly at the 5-hour and 20-minute point.  There was no active bleeding  visualized anywhere during the procedure.   IMPRESSION:  1. Delayed gastric emptying of capsule.  2. Nonspecific appearance of inflammation with shallow ulceration in      the proximal small bowel.  3. Intermittent visualization of apparent colonic, alternating with      small-intestinal mucosa over a 2-hour period from 4 hours and 25      minutes to 6 hours and 20 minutes.  4. No active bleeding.   PLAN:  The patient will need review of surgical history and probably  further small-bowel imaging with either CT, enterography or small bowel  series.           ______________________________  Everardo All Madilyn Fireman, M.D.     JCH/MEDQ  D:  08/16/2008  T:  08/16/2008  Job:  [id number]

## 2010-11-10 NOTE — Discharge Summary (Signed)
Nathaniel Stone, OAXACA                ACCOUNT NO.:  [id number]   MEDICAL RECORD NO.:  [id number]          PATIENT TYPE:  INP   LOCATION:  1440                         FACILITY:  [facility]   PHYSICIAN:  Monte Fantasia, MD  DATE OF BIRTH:  May 30, 1929   DATE OF ADMISSION:  08/13/2008  DATE OF DISCHARGE:                               DISCHARGE SUMMARY   PRIMARY CARE PHYSICIAN:  Merlene Laughter. Renae Gloss, M.D.   GI CONSULT:  Everardo All. Madilyn Fireman, M.D.   PROCEDURES DONE DURING THE STAY IN THE HOSPITAL:  Capsule endoscopy done  on August 16, 2008 and small bowel followthrough done on August 17, 2008.   DISCHARGE DIAGNOSES:  1. Symptomatic anemia secondary to blood loss.  2. Multiple distal duodenal and general diverticula.  3. Lower gastrointestinal bleed.  4. Dementia.  5. Gastroesophageal reflux disease.  6. Hypertension.  7. Hyperlipidemia.  8. Coronary artery disease.  9. Glaucoma.   MEDICATIONS UPON DISCHARGE:  1. Protonix 40 mg p.o. q.12h. for 8 weeks.  2. Ferrous sulfate 325 mg p.o. b.i.d.  3. MiraLax 17 grams p.o. daily.  4. Cosopt eye drops 1 drop each eye twice daily.  5. Travatan eye drops 1 drop each eye twice daily.  6. Namenda 10 mg twice daily.  7. Aricept 5 mg p.o. daily.  8. As per recommendations of Dr. Madilyn Fireman to hold aspirin and Plavix      until seen by him on follow up.  Recommended to continue PPI for 2      months and to follow up with Dr. Madilyn Fireman in 2-3 weeks.   COURSE DURING THE HOSPITAL STAY:  [name]  was admitted on February 16, with complaints of two episodes of  hematemesis.  As per history the patient had been complaining of stomach  discomfort on and off for past 1 week.  The patient denied of any  complaints of chest pain or lightheadedness.  The patient was admitted  to the step-down unit in the ICU for closer monitoring and was fluid  resuscitated.  The patient's H and H was closely monitored.  Aspirin and  Plavix was held in  view of the same.  The patient had gradual  downward  trend of his hemoglobin and hence the patient was evaluated by GI.  The  patient underwent an EGD which was done on August 13, 2008, which  showed no evidence of any bleeding from the stomach the duodenal bulb  and second portion was okay without any evidence of any bleeding.  The  distal duodenal site of bleed was not identified.  The patient was  started on clear liquid diet and was given IV Protonix for the same.  The patient then had a CT of the abdomen and pelvis done due to the  gradual downward trend of the hemoglobin which as per the CT scan of the  abdomen and pelvis showed a small bowel wall thickening with  inflammatory changes involving the distal duodenum and the proximal  jejunum.  However, the CT abdomen was essentially negative  and hence  the patient  was scheduled for a small bowel capsule endoscopy.  The  capsule endoscopy was done by Dr. Madilyn Fireman on August 16, 2008, which  showed delayed gastric emptying of the capsule, nonspecific inflammation  with shallow ulceration in the proximal small bowel, intermittent  visualization of apparent colonic alternating with small intestinal  mucosa over a 2-hour period. No active site of bleeding was identified.  The patient then had a small bowel followthrough which was done on  August 17, 2008, which showed diffuse jejunal and duodenal  diverticula.  The patient for past 48 hours have been maintaining a  stable hemoglobin and had no active bleed or melena in his stools.  The  patient at present is stable to be discharged as per the GI doctors and  the patient recommended to hold aspirin and Plavix and discontinue on  PPI for 8 weeks.  The patient will have physical therapy evaluation for  the same.  The patient's family has been counseled if the patient has  repeated any symptoms of passing black bloody stools or generalized  weakness and dizziness to either contact the  primary care physician or  come down to the emergency room.  The patient does need monitoring with  his H and H as an outpatient.   RADIOLOGICAL INVESTIGATIONS DONE DURING THE STAY IN THE HOSPITAL:  1. Chest x-ray done on August 13, 2008.  Impression:  Marked gaseous      distention of the stomach, cholecystectomy and left basilar      atelectasis.  2. CT of the abdomen and pelvis with contrast done on August 14, 2008.  Impression:  Small bowel wall thickening with adjacent      inflammatory changes involving the distal duodenum and proximal      jejunum, nonspecific and differential diagnosis includes enteritis      and small bowel ischemia with lymphoma considered less likely.  No      evidence of splenomegaly or adenopathy, mild biliary ductal      dilatation which may be related to prior cholecystectomy.  3. CT of the pelvis.  Impression:  Small amount of free fluid noted in      the pelvic cul-de-sac, small left inguinal hernia containing only      fat.  4. Small bowel followthrough done on August 17, 2008.  Impression:      A large number of duodenal and proximal jejunum diverticula.  5. Abdominal x-ray done on August 19, 2008.  Capsule not visualized,      minimal diverticular change noted.   PROCEDURES DONE DURING THE STAY IN THE HOSPITAL:  Capsule endoscopy done  on August 16, 2008 by Everardo All. Madilyn Fireman, M.D.  Impression:  Delayed  gastric emptying of the capsule, nonspecific appearance of the  inflammation with shallow ulceration in the proximal small bowel,  intermittent visualization of apparent colonic and alternating with  small intestinal mucosa over a 2-hour period from 4 hours and 25 minutes  to 6 hours and 20 minutes.  No active site of bleed.   LABS DONE DURING THE STAY IN THE HOSPITAL:  Total WBC 9.6, hemoglobin  9.7, hematocrit 29.4, platelet of 254.  Retic count is 2.1, total iron  20, TIBC is 71, total percentage is 12%.  Vitamin B12 was 349.   Folate  was more than 20 and ferritin was 101.  Sodium 141, potassium 4,  chloride 110, bicarb 26, glucose 97, BUN 3, creatinine 0.7 and calcium  of  8.5, phosphorus 2.6, magnesium 2.   VITALS:  Today temperature of 97.7, pulse 69, respirations 16, blood  pressure 121/73, oxygen saturation 98% on room air.  HEENT EXAMINATION:  Neck is supple.  Pupils equal reacting to light.  No  pallor, no lymphadenopathy.  RESPIRATORY:  Air entry is bilaterally equal.  No rales, no rhonchi.  CARDIOVASCULAR EXAMINATION:  S1 and S2, regular rate and rhythm.  ABDOMEN:  Soft.  No guarding or rigidity.  No tenderness.  EXTREMITIES: No edema in feet.   DISPOSITION:  The patient is stable to be discharged and will have a  physical therapy evaluation and also the patient's family was counseled  to follow up with the primary care physician within a 1 week and with  John C. Madilyn Fireman, M.D. with a scheduled appointment on March 8.  Also, the  patient's family has been counseled if the patient complains of  generalized weakness or has melena or dark colored stools to inform the  primary care physician or  come into the ER.  The patient is also recommended as per GI to be  discharged on PPI for 2 months and to hold aspirin and Plavix until the  follow up with John C. Madilyn Fireman, M.D., the gastroenterologist.  The patient  at present is stable from a GI standpoint.      Monte Fantasia, MD  Electronically Signed     MP/MEDQ  D:  08/19/2008  T:  08/19/2008  Job:  [id number]   cc:   Merlene Laughter. Renae Gloss, M.D.  Fax: [phone]

## 2010-11-10 NOTE — H&P (Signed)
NAMEKNOX, CERVI NO.:  [id number]   MEDICAL RECORD NO.:  [id number]          PATIENT TYPE:  INP   LOCATION:  1236                         FACILITY:  [facility]   PHYSICIAN:  Della Goo, M.D. DATE OF BIRTH:  1928/12/14   DATE OF ADMISSION:  08/13/2008  DATE OF DISCHARGE:                              HISTORY & PHYSICAL   ADMISSION DATE:  August 13, 2008.   PRIMARY CARE PHYSICIAN:  Dr. Andi Devon.   CHIEF COMPLAINT:  Vomiting blood.   HISTORY OF PRESENT ILLNESS:  This is a 77 year old male who was brought  to the emergency department emergently secondary to 2 episodes of  hematemesis.  The patient's daughter witnessed the event and states the  bleeding started at about 11 p.m., and patient was brought to the  emergency department subsequently.  The patient had been complaining of  having some stomach discomfort during the past week off and on.  There  was no report of patient having any melena or passage of bright red  blood per rectum or hematochezia.  The patient denies being fatigued,  short of breath.  Also denies having any chest pain or lightheadedness.  He presented to the [facility]  Emergency Department where he  was evaluated, and his initial hemoglobin level was found to be 13.8.  The patient does have a prior history of a peptic ulcer/GI bleed in the  past per report of his son who is at bedside.   PAST MEDICAL HISTORY:  Significant for:   1. Previous GI bleeds/peptic ulcer disease.  2. Gastroesophageal reflux disease.  3. Hypertension.  4. Hyperlipidemia.  5. Coronary artery disease.  6. Dementia.  7. Glaucoma.   PAST SURGICAL HISTORY:  1. History of a prior L4-L5 laminotomy and microdiskectomy secondary      to a herniated nucleus pulposus with radiculopathy in November      2002.  2. Also history of 2 cardiac catheterizations with PTCA and stent      placement in December 2007 and again in February 2008.   MEDICATIONS:  Will need to be further verified.  The patient is on  Aricept therapy, Plavix, aspirin, Namenda, multivitamin, Cosopt, and  Travatan eye drops.   ALLERGIES:  PENICILLIN.   SOCIAL HISTORY:  The patient lives in his own home, and his daughter  lives with him and is his primary caretaker.  He is a nonsmoker,  nondrinker.   FAMILY HISTORY:  Noncontributory.   REVIEW OF SYSTEMS:  Pertinents are mentioned above.   PHYSICAL EXAMINATION FINDINGS:  This is a 77 year old pleasantly  confused, well-nourished, well-developed male in discomfort, but no  acute distress.  VITAL SIGNS:  Initially were temperature 98.0, blood pressure 98/66,  heart rate 109, respirations 20, O2 saturations 100%.  HEENT:  Normocephalic, atraumatic.  There is no scleral icterus.  Pupils  are equally round, reactive to light.  Extraocular movements are intact,  funduscopic benign, oropharynx is clear.  NECK:  Supple, full range of motion.  No thyromegaly, adenopathy or  jugular venous distention.  CARDIOVASCULAR:  Tachycardic rate, rhythm.  No  murmurs, gallops or rubs.  LUNGS:  Clear to auscultation bilaterally.  ABDOMEN:  Positive bowel sounds which sound increased, soft, nontender,  nondistended.  There is no hepatosplenomegaly.  There are surgical  changes which are consistent with a laparoscopic surgery of some type,  possibly a laparoscopic cholecystectomy.  EXTREMITIES:  Are without cyanosis, clubbing or edema.  NEUROLOGIC:  The patient is confused but pleasant.  His cranial nerves  are intact, and there are no motor or sensory deficits.   LABORATORY STUDIES:  White blood cell count 12.9, hemoglobin 13.8,  hematocrit 40.0, MCV 89.6, platelets 249, neutrophils 85%, lymphocytes  9%.  Protime 14.1, INR 1.1.  Sodium 142, potassium 4.2, chloride 106,  carbon dioxide 29, BUN 28, creatinine 1.1, and glucose 117.  Lipase  level 152.   Chest x-ray reveals marked gaseous distention of the stomach,  prior  cholecystectomy, and left base atelectasis.   ASSESSMENT:  A 77 year old male being admitted with:   1. Gastrointestinal bleeding/hematemesis.  2. Abdominal pain.  3. Transient hypotension.  4. Coronary artery disease.   PLAN:  The patient will be admitted to a step-down ICU area.  He will be  placed on IV fluids for fluid resuscitation and hydration.  A clear  liquid diet has been ordered.  Since his hemoglobin level is 13.8 on  admission, 2 units of packed red blood cells have been ordered and  placed on hold.  Serial H and H's will be checked, and patient will be  transfused if needed if his hemoglobin continues to drop and/or patient  has signs of hemodynamic instability.  An  IV Protonix drip has been ordered and patient will be placed in SCDs,  sequential compression devices, to the bilateral lower extremities for  deep venous thrombosis prophylaxis.  A gastroenterology consultation  will also be placed for further GI workup.      Della Goo, M.D.  Electronically Signed     HJ/MEDQ  D:  08/13/2008  T:  08/13/2008  Job:  [id number]   cc:   Merlene Laughter. Renae Gloss, M.D.  Fax: [phone]

## 2010-11-10 NOTE — Op Note (Signed)
Nathaniel Stone, Nathaniel Stone NO.:  [id number]   MEDICAL RECORD NO.:  [id number]          PATIENT TYPE:  INP   LOCATION:  1236                         FACILITY:  [facility]   PHYSICIAN:  Nathaniel Stone, M.D.   DATE OF BIRTH:  Mar 20, 1929   DATE OF PROCEDURE:  08/13/2008  DATE OF DISCHARGE:                               OPERATIVE REPORT   Diagnostic esophagogastroduodenoscopy.   INDICATION:  Nathaniel Stone is a 77 year old male born Jul 04, 1928.  The patient is admitted to the hospital to evaluate and treat upper  gastrointestinal bleeding manifested by hematemesis and the passage of  melenic stool.  He chronically takes aspirin and Plavix.  He has a  history of peptic ulcer disease in the past.   ENDOSCOPIST:  Nathaniel Stone, M.D.   PREMEDICATION:  Fentanyl 25 mcg, Versed 5 mg, glucagon 0.5 mg.   PROCEDURE:  After obtaining informed consent, [name] was placed in  the left lateral decubitus position.  I administered intravenous  fentanyl and intravenous Versed to achieve conscious sedation for the  procedure.  The patient's blood pressure, oxygen saturation and cardiac  rhythm were monitored throughout the procedure and documented in the  medical record.   The Pentax gastroscope was passed through the posterior hypopharynx into  the proximal esophagus without difficulty.  The hypopharynx, larynx and  vocal cords appeared normal.   Esophagoscopy:  The proximal, mid and lower segments of the esophageal  mucosa appeared normal.   Gastroscopy:  Upon entering the stomach with the gastroscope, there was  a large amount of liquid and particulate food matter retained in the  stomach suggesting gastroparesis, as I found no signs of mechanical  gastric outlet obstruction.  Retroflexed view of the gastric cardia and  fundus appeared normal.  I could not visualize a small portion of the  greater curvature/proximal gastric body.  Otherwise, the gastric body  and antrum  appeared normal.  The pylorus was somewhat narrowed but there  was no gastric outlet obstruction.  There was no bleeding identified  from the stomach.   Duodenoscopy:  The duodenal bulb and second portion of duodenum appeared  normal.  Blood appeared to be welling up from the distal duodenum into  the distal second portion of the duodenum, although I was unable to  reach this area with the duodenoscope and unable to identify the site of  bleeding.   ASSESSMENT:  1. ?  Gastroparesis.  2. Distal duodenal bleeding, site not identified.   RECOMMENDATIONS:  Clear liquid diet.  Intravenous Protonix.  Monitor the  pace of upper GI bleeding by monitoring his CBC.  If significant  bleeding recurs, I would recommend sending him to radiology for  angiography prior to going to surgery.           ______________________________  Nathaniel Stone, M.D.     MJ/MEDQ  D:  08/13/2008  T:  08/13/2008  Job:  [id number]

## 2010-11-13 NOTE — Discharge Summary (Signed)
Nathaniel Stone, SHANAHAN NO.:  [id number]   MEDICAL RECORD NO.:  [id number]          PATIENT TYPE:  INP   LOCATION:  4729                         FACILITY:  [facility]   PHYSICIAN:  Nanetta Batty, M.D.   DATE OF BIRTH:  10-30-1928   DATE OF ADMISSION:  08/09/2006  DATE OF DISCHARGE:  08/11/2006                               DISCHARGE SUMMARY   DISCHARGE DIAGNOSES:  1. Chest pain, myocardial infarction ruled out, no in-stent restenosis      at catheterization.  2. Right coronary artery stenting December 2007.  3. Treated hypertension.  4. Bradycardia, beta-blocker was stopped this admission.   HOSPITAL COURSE:  The patient is a 77 year old male followed by Dr.  [name] and Dr. Barbee Shropshire with a history of coronary disease.  He had RCA  stent placed in December 2007, TAXUS stent.  He was admitted from the  office August 09, 2006, with recurrent chest pain.  He was admitted to  telemetry, CK-MBs and troponins were negative.  The patient was put on  nitroglycerin and heparin on admission.  Catheterization done by Dr.  Clarene Duke, August 10, 2006, revealed no restenosis of the RCA stent.  LAD  and circumflex were without significant stenosis.  EF was normal.  We  added a nonsteroidal anti-inflammatory.  We did stop his beta blocker  during this admission as his heart rate would drop into the low 40s at  night.  He will follow up with Dr. Barbee Shropshire for possible GI evaluation,  although he denies any pain with swallowing or discomfort after eating.   DISCHARGE MEDICATIONS:  1. Aricept 5 mg a day.  2. Coated aspirin once a day.  3. Plavix 75 mg a day.  4. Vytorin 10/22 daily.  5. Prevacid 30 mg a day.  6. Diclofenac 75 mg b.i.d. for 5 days.   LABORATORY DATA:  Sodium 140, potassium 3.6, BUN 9 , creatinine 0.7.  INR 1.1.  White count 5.7, hemoglobin 13.4, hematocrit 40.6, platelets  221.  Liver functions were normal.  Lipids showed an LDL of 50, total  cholesterol 97.   EKG  shows sinus rhythm and sinus bradycardia with a rate of 48.   Chest x-ray is stable, no acute pulmonary process.   DISPOSITION:  The patient is discharged in stable condition and will  follow-up with Dr. [name] and Dr. Barbee Shropshire.      [name], P.A.      Nanetta Batty, M.D.  Electronically Signed    LKK/MEDQ  D:  08/11/2006  T:  08/11/2006  Job:  [id number]   cc:   Olene Craven, M.D.

## 2010-11-13 NOTE — Cardiovascular Report (Signed)
Nathaniel Stone, Nathaniel Stone NO.:  [id number]   MEDICAL RECORD NO.:  [id number]          PATIENT TYPE:  INP   LOCATION:  2807                         FACILITY:  [facility]   PHYSICIAN:  Nanetta Batty, M.D.   DATE OF BIRTH:  [birth year]   DATE OF PROCEDURE:  06/07/2006  DATE OF DISCHARGE:                            CARDIAC CATHETERIZATION   HISTORY OF PRESENT ILLNESS:  [name] is a 76 year old married,  African American male, father of 4, grandfather of 2 grandchildren,  retired Teacher, music who was referred by Dr. Barbee Shropshire for  abnormal nuclear perfusion study, performed April 13, 2006, revealing  inferolateral ischemia.  He does have a history of 90 pack year tobacco  abuse.  He complains of occasional chest pain.  Cath performed at  [facility] May 27, 2006, revealed long 80% segmental  stenosis in the RCA.  The patient presents now for PCI stenting.   DESCRIPTION OF PROCEDURE:  The patient was brought to the second floor  [facility] in the postabsorptive state.  Premedicated p.o. Valium.  Prepped and shaved in the usual sterile  fashion.  Xylocaine 1% was used for local anesthesia.  A 7-French sheath  was inserted into the right femoral artery using standard Seldinger  technique.  A 7-French hockey stick with side-hole guide catheter  __________ Leota Jacobsen soft wire and a __________ Maverick were used for PCI.  The patient was on aspirin and Plavix as an outpatient.  Received an  additional 300 mg of p.o. Plavix prior to intervention.  Received  AngioMax bolus with an ACT of 386.   The hockey stick with side-hole guide catheter was placed in the ostium  of the RCA without damping.  The wire was passed into the distal right  coronary artery easily and the vessel was predilated.  Following this, a  30-24 Taxus drug-eluting stent was then positioned angiographically and  fluoroscopically and deployed at 12 atmospheres.   Intracoronary  nitroglycerin 200 mcg was then administered.  The stent was post dilated  with a 325-20 Quantum Maverick at 14 atmospheres (3.3 mm) resulting in  excellent angiographic result without evidence of dissection.   IMPRESSION:  Successful PCI and stenting of a long high-grade mid  dominant RCA stenosis to 0% residual with drug-eluting stenting and  AngioMax.  The guidewire and catheter were removed.  Sheath was then  securely placed.  The patient was in suboptimal condition.  He will be  hydrated, treated with aspirin and Plavix, and will remain overnight.  He will be discharged home in the morning.  I will see him back in the  office in approximately one week for follow-up.  Dr. Loney Laurence office  was notified of these results.  Patient left hospital in stable  condition.      Nanetta Batty, M.D.  Electronically Signed     JB/MEDQ  D:  06/07/2006  T:  06/07/2006  Job:  [id number]   cc:   [facility]  [facility]  Olene Craven, M.D.

## 2010-11-13 NOTE — Discharge Summary (Signed)
NAMEBRAYDEN, Nathaniel Stone NO.:  [id number]   MEDICAL RECORD NO.:  [id number]          PATIENT TYPE:  INP   LOCATION:  6533                         FACILITY:  [facility]   PHYSICIAN:  Nathaniel Stone, M.D.   DATE OF BIRTH:  [birth year]   DATE OF ADMISSION:  06/07/2006  DATE OF DISCHARGE:  06/08/2006                               DISCHARGE SUMMARY   DISCHARGE DIAGNOSES:  1. Coronary artery disease status post RCA stenting during this      admission.  2. Family history of coronary artery disease.  3. Glaucoma.   HOSPITAL PROCEDURE:  Cardiac catheterization performed on June 07, 2006 and during that catheterization the TAXUS stent was inserted in RCA  with resolution of the stenosis from 80 to 0%.  The patient tolerated  the procedure well.   HISTORY OF PRESENT ILLNESS:  This is a 77 year old, married, African  American male who was referred to our office for evaluation of chest  pain by Dr. Barbee Stone.  He has a family history of coronary artery  disease but no personal history of heart attack or heart problems and no  personal history of risk factors such as diabetes mellitus or  hypertension or hyperlipidemia.  He underwent Cardiolite stress test in  our office on April 13, 2006 that showed no significant ischemia, but  because of this and family history of heart disease and prior history of  long tobacco abuse and complaints of left inframammary chest pain, Dr.  Allyson Stone decided to schedule him for outpatient catheterization at [facility].  That catheterization revealed 80% stenosis of the  mid-RCA and 40% stenosis of the distal RCA as well as 40% stenosis of  the proximal and mid-portions of LAD.  Patient was scheduled for  intervention at [facility] and presented for that event and was  admitted to the hospital for that procedure on June 07, 2006.  For a  full description of the intervention, please refer to dictated note by  Dr. Allyson Stone.   The patient tolerated the procedure well.  The next morning  he was evaluated by Dr. Allyson Stone.  His groin site was stable, resulting in  signs of hematoma and ecchymosis.  His blood pressure was well  controlled.  Groin site did not reveal any signs of ecchymosis or  hematoma.   RECOMMENDATIONS:  We discharged patient home on June 08, 2006 with  the following recommendations.  1. Diet:  Low salt, low cholesterol diet.  2. Activities:  No driving for three days and no lifting greater than      5 pounds for three days.  3. Avoid strenuous activity.  4. Patient was allowed to shower with no bath and instructed to call      our office with any problems of groin site such as oozing,      bleeding, swelling, redness and temperature.   DISCHARGE MEDICATIONS:  1. Plavix 75 mg daily.  2. Aspirin 325 mg daily.  3. Toprol XL 25 mg daily.  4. Vytoren 10 per 20 mg daily.  5. Aricept 5  mg daily.  6. Eye drops as before.   FOLLOW UP:  Follow up appointment scheduled with Dr. Allyson Stone on 06/16/2006  at 12:15.      Nathaniel Stone, P.A.      Nathaniel Stone, M.D.  Electronically Signed    MK/MEDQ  D:  06/08/2006  T:  06/09/2006  Job:  [id number]   cc:   Nathaniel Stone, M.D.

## 2010-11-13 NOTE — Cardiovascular Report (Signed)
NAMEDARE, SANGER NO.:  [id number]   MEDICAL RECORD NO.:  [id number]          PATIENT TYPE:  INP   LOCATION:  4729                         FACILITY:  [facility]   PHYSICIAN:  Thereasa Solo. Little, M.D. DATE OF BIRTH:  1929/01/31   DATE OF PROCEDURE:  08/10/2006  DATE OF DISCHARGE:                            CARDIAC CATHETERIZATION   INDICATIONS FOR TEST:  This 77 year old male had a 3.0 x 24 TAXUS stent  placed to his RCA June 07, 2006.  He presented on August 09, 2006,  with significant left arm complaints.  He has ruled out for myocardial  infarction, has been maintained on IV nitroglycerin and heparin, and is  brought to the catheterization lab for repeat cardiac catheterization to  reassess his coronary anatomy and make sure that the RCA stent is still  widely patent.   After obtaining informed consent, the patient was prepped and draped in  the usual sterile fashion exposing the right groin.  Following local  anesthetic with 1% Xylocaine, the Seldinger technique was employed, and  a 5-French introducer sheath was placed into the right femoral artery.  Left and right coronary arteriography and ventriculography in the RAO  projection were performed.   EQUIPMENT:  5-French Judkins diagnostic catheters.   TOTAL CONTRAST USED:  60 mL   COMPLICATIONS:  None.   ACT at the beginning of the procedure was 202.   RESULTS:  1. Hemodynamic monitoring: Central aortic pressure was 122/56.  Left      ventricular pressure was 125/5 with no significant valve gradient      noted at time of pullback.  2. Ventriculography.  Ventriculography in the RAO projection revealed      normal LV systolic function.  Ejection fraction of excess of 60%.      The end-diastolic pressure was 12. No mitral regurgitation was      seen.   CORONARY ARTERIOGRAPHY:  On fluoroscopy, the stent could be seen in the  distribution of the RCA.  1. Left main normal and bifurcated.  2. LAD:   The LAD extended down to the apex of the heart. In the mid      LAD was mild minor irregularities.  The first diagonal was a medium-      size vessel and free of disease.  3. Circumflex:  The circumflex basically gave rise to one OM vessel      that had 20% ostial narrowing of the OM.  There were mild      irregularities in the distal OM.  4. RCA:  The stent was visualized in the mid portion of the RCA.  It      was widely patent.  There was an area in the distal RCA of      eccentric narrowing of approximately 30%, and this is unchanged      from December 2007. The PDA and three posterior lateral branches      were all free of disease.   CONCLUSION:  1. Normal left ventricular systolic function.  2. Widely patent stent in the right coronary artery with only minor  irregularities in other vessels.   I cannot explain his arm pain from his cardiac anatomy.  I plan to  discontinue his heparin and nitroglycerin.  The patient should be ready  for discharge from the hospital in the morning.  If he has recurrent arm  pain, will probably need orthopedic evaluation.           ______________________________  Thereasa Solo Little, M.D.     ABL/MEDQ  D:  08/10/2006  T:  08/10/2006  Job:  [id number]   cc:   Olene Craven, M.D.  Nanetta Batty, M.D.  Catheterization Lab

## 2010-11-13 NOTE — Op Note (Signed)
[place]  Patient:    Nathaniel Stone, Nathaniel Stone Visit Number: [id number] MRN: [id number]          Service Type: SUR Location: [id number] Attending Physician:  Donn Pierini Dictated by:   Julio Sicks, M.D. Proc. Date: 05/23/01 Admit Date:  05/23/2001                             Operative Report  PREOPERATIVE DIAGNOSIS:  Left L4-5 herniated nucleus pulposus with radiculopathy.  POSTOPERATIVE DIAGNOSIS:  Left L4-5 herniated nucleus pulposus with radiculopathy.  OPERATION PERFORMED:  Left L4-5 laminotomy and microdiskectomy.  SURGEON:  Julio Sicks, M.D.  ASSISTANT:  Donalee Citrin, Montez Hageman.  ANESTHESIA:  General endotracheal.  INDICATIONS FOR PROCEDURE:  Mr. Nathaniel Stone is a 77 year old male with a history of left lower extremity pain, paresthesias and weaknesses and left-sided L5 radiculopathy which has failed conservative management.  MRI scan demonstrates left-sided L4-5 stenosis with an associated left-sided disk herniation with an inferior free fragment causing compression of the left-sided L5 nerve root. the patient has been counseled as to his options.  He has decided to proceed with a left-sided L4-5 laminotomy and microdiskectomy for hopeful relief of his symptoms.  DESCRIPTION OF PROCEDURE:  The patient was taken to the operating room and placed on the operating table in supine position.  After an adequate level of anesthesia was achieved, the patient was positioned prone onto a Wilson frame and appropriately padded.  The patients lumbar region was shaved and prepped sterilely.  A 10 blade was used to make a linear skin incision overlying the L4-5 interspace.  This was carried down sharply in the midline.  Subperiosteal dissection was performed on the left side exposing the lamina and facet joints at L4 and L5.  The deep self-retaining retractor was placed.  Intraoperative x-ray was taken and the L4-5 level was confirmed.  A laminotomy was  then performed using a high speed drill and Kerrison rongeurs to remove the inferior one third of the lamina of L4, the medial one third of the L4-5 facet joint and the superior rim of the L5.  The ligamentum flavum was then elevated and resected in piecemeal fashion using Kerrison rongeurs.  The underlying thecal sac was identified as was the exiting L5 nerve root.  The epidural venous plexus was coagulated and cut.  Microscope was brought into the field for microdissection of the thecal sac and the underlying disk herniation.  The thecal sac and the L5 nerve root were mobilized and retracted towards the midline.  The disk herniation is readily apparent.  This was incised with a 15 blade in rectangular fashion.  A wide disk space clean out was then achieved using pituitary rongeurs, upward angled pituitary rongeurs and Epstein curets. All elements of the disk herniation were completely dissected.  All loose or obviously degenerated disk material was removed from the interspace.  After very thorough diskectomy was performed, the wound was inspected.  There was no evidence of any further compression of the thecal sac or nerve roots.  There was no evidence of injury to the thecal sac or nerve roots.  The wound was then copiously irrigated with antibiotic solution.  Gelfoam was placed topically for hemostasis which was found to be good.  Microscope and retractor system were removed.  Hemostasis in the muscle achieved with electrocautery. The wound was then closed in layers with Vicryl sutures.  Steri-Strips and sterile dressing  were applied.  There were no apparent complications.  The patient tolerated the procedure well and he returned to the recovery room postoperatively. Dictated by:   Julio Sicks, M.D. Attending Physician:  Donn Pierini DD:  05/23/01 TD:  05/23/01 Job: [id number]

## 2010-11-24 ENCOUNTER — Telehealth: Payer: Self-pay | Admitting: Internal Medicine

## 2010-11-24 NOTE — Telephone Encounter (Signed)
Refill- namenda 10mg  tablet. Take 1 tablet by mouth two times a day. Qty 60. Last fill 3.7.12  Refill- mirtazapine 15mg  tablet. One by mouth at bedtime. Qty 30. Last fill 2.25.12  Refill- donepezil hcl 10mg  tablet. Take 1 tablet by mouth once a day. Qty 30. Last fill 3.7.12

## 2010-11-25 NOTE — Telephone Encounter (Signed)
Spoke to [place] at pharmacy and verified that meds requested below have refills on file. They will refill meds per previous authorizations.

## 2010-12-08 ENCOUNTER — Telehealth: Payer: Self-pay | Admitting: Internal Medicine

## 2010-12-08 NOTE — Telephone Encounter (Signed)
REFILL CITALOPRAM HBR 10 MG TABLET LAST  FILL 10-18-2010 QTY 30 TAKE 1 TABLET BY MOUTH EVERY DAY

## 2010-12-09 MED ORDER — CITALOPRAM HYDROBROMIDE 10 MG PO TABS: 10.0000 mg | ORAL_TABLET | Freq: Every day | ORAL | Status: DC

## 2010-12-09 NOTE — Telephone Encounter (Signed)
Call placed to patient at [phone], spoke with patients daughter Gershon Cull she has verified that patient is taking the Citalopram 10 mg on a regular basis. She stated the pharmacy filled 2 rx's at one time which is why the last refill is showing for April.  Rx refills sent to pharmacy

## 2010-12-28 ENCOUNTER — Ambulatory Visit: Payer: MEDICARE | Admitting: Internal Medicine

## 2010-12-28 ENCOUNTER — Ambulatory Visit: Payer: MEDICARE | Admitting: Family

## 2010-12-31 ENCOUNTER — Telehealth: Payer: Self-pay | Admitting: Internal Medicine

## 2010-12-31 MED ORDER — OMEPRAZOLE 20 MG PO CPDR: 20.0000 mg | DELAYED_RELEASE_CAPSULE | Freq: Every day | ORAL | Status: DC

## 2010-12-31 NOTE — Telephone Encounter (Signed)
Rx refill sent to pharmacy. 

## 2010-12-31 NOTE — Telephone Encounter (Signed)
Refill-omeprazole dr 20mg  capsule. Take 1 capsule every day before morning meal. Qty 30. Last fill 5.18.12

## 2011-01-05 ENCOUNTER — Ambulatory Visit (INDEPENDENT_AMBULATORY_CARE_PROVIDER_SITE_OTHER): Payer: MEDICARE | Admitting: Family

## 2011-01-05 ENCOUNTER — Encounter: Payer: Self-pay | Admitting: Family

## 2011-01-05 DIAGNOSIS — F068 Other specified mental disorders due to known physiological condition: Secondary | ICD-10-CM

## 2011-01-05 DIAGNOSIS — I10 Essential (primary) hypertension: Secondary | ICD-10-CM

## 2011-01-05 DIAGNOSIS — F4320 Adjustment disorder, unspecified: Secondary | ICD-10-CM

## 2011-01-05 NOTE — Patient Instructions (Signed)
Please follow up in 3 months.  

## 2011-01-05 NOTE — Assessment & Plan Note (Signed)
On aricept/namenda- at baseline per son.

## 2011-01-05 NOTE — Assessment & Plan Note (Signed)
Stable not on meds

## 2011-01-05 NOTE — Progress Notes (Signed)
Subjective:    Patient ID: Nathaniel Stone, male    DOB: 1928-12-21, 77 y.o.   MRN: [id number]  HPI  Nasal congestion, scratchy throat, no fevers.    Dementia- unchanged per son.  He continues namenda/aricept.  He lives with his daughter and son-in-law.  Pt is up a lot at night- + wandering around the house at night.  Naps frequently throughout the day.  He takes Remeron at bedtime.    Depression- lost wife last November of 62 yrs.   Daughter has stage 4 lung cancer.  HTN- controlled with diet only. Not on BP meds.     Review of Systems    see HPI  Past Medical History  Diagnosis Date  . GERD (gastroesophageal reflux disease)   . Hypertension   . History of GI bleed     multiple distal duodenal and general diverticula  . Dementia   . Glaucoma   . CAD (coronary artery disease)   . Hyperlipidemia     History   Social History  . Marital Status: Married    Spouse Name: N/A    Number of Children: 4  . Years of Education: N/A   Occupational History  . Retired Corporate investment banker    Social History Main Topics  . Smoking status: Former Games developer  . Smokeless tobacco: Not on file  . Alcohol Use: Not on file  . Drug Use: Not on file  . Sexually Active: Not on file   Other Topics Concern  . Not on file   Social History Narrative  . No narrative on file    No past surgical history on file.  Family History  Problem Relation Age of Onset  . Cancer Son     kidney    No Known Allergies  Current Outpatient Prescriptions on File Prior to Visit  Medication Sig Dispense Refill  . cetirizine (ZYRTEC) 10 MG tablet Take 10 mg by mouth daily.        Marland Kitchen donepezil (ARICEPT) 10 MG tablet Take 1 tablet (10 mg total) by mouth at bedtime.  30 tablet  5  . mirtazapine (REMERON) 15 MG tablet Take 1 tablet (15 mg total) by mouth at bedtime.  15 tablet  3  . vitamin B-12 (CYANOCOBALAMIN) 1000 MCG tablet Take 1,000 mcg by mouth 3 (three) times daily.        . citalopram (CELEXA) 10 MG tablet  Take 1 tablet (10 mg total) by mouth daily.  30 tablet  2  . diclofenac sodium (VOLTAREN) 1 % GEL Apply topically. Apply 3 times a day.       . dorzolamide-timolol (COSOPT) 22.3-6.8 MG/ML ophthalmic solution Place 1 drop into both eyes 2 (two) times daily.        . fluticasone (FLONASE) 50 MCG/ACT nasal spray 2 sprays by Nasal route daily.  16 g  3  . memantine (NAMENDA) 10 MG tablet Take 0.5 tablets (5 mg total) by mouth 2 (two) times daily.  30 tablet  2  . omeprazole (PRILOSEC) 20 MG capsule Take 1 capsule (20 mg total) by mouth daily. Before morning meal.  30 capsule  3  . travoprost, benzalkonium, (TRAVATAN) 0.004 % ophthalmic solution Place 1 drop into both eyes at bedtime.          BP 104/66  Pulse 60  Temp(Src) 97.7 F (36.5 C) (Oral)  Resp 16  Ht 5' 9.02" (1.753 m)  Wt 140 lb (63.504 kg)  BMI 20.66 kg/m2    Objective:  Physical Exam  Constitutional:       Thin elderly black male, pleasant, NAD (son accompanies him today)  Cardiovascular: Normal rate and regular rhythm.   Pulmonary/Chest: Effort normal and breath sounds normal.  Psychiatric:       Somewhat child like affect, cheerful.  Unable to name president or year.  Oriented to person.          Assessment & Plan:

## 2011-01-05 NOTE — Assessment & Plan Note (Signed)
Stable, continue citalopram.  

## 2011-01-09 NOTE — Assessment & Plan Note (Signed)
No change.  Continue aricept and namenda.

## 2011-01-09 NOTE — Assessment & Plan Note (Signed)
Use remeron for mood and weight stablization.  Encouraged family support

## 2011-02-05 ENCOUNTER — Telehealth: Payer: Self-pay | Admitting: Internal Medicine

## 2011-02-05 DIAGNOSIS — F068 Other specified mental disorders due to known physiological condition: Secondary | ICD-10-CM

## 2011-02-05 MED ORDER — MIRTAZAPINE 15 MG PO TABS: 7.5000 mg | ORAL_TABLET | Freq: Every day | ORAL | Status: DC

## 2011-02-05 NOTE — Telephone Encounter (Signed)
Rx refill sent to pharmacy. 

## 2011-02-05 NOTE — Telephone Encounter (Signed)
Refill- mirtazapine 15mg  tablet. Take one tablet by mouth at bedtime. Qty 30. Last fill 7.7.12

## 2011-03-03 ENCOUNTER — Telehealth: Payer: Self-pay | Admitting: Internal Medicine

## 2011-03-03 ENCOUNTER — Other Ambulatory Visit: Payer: Self-pay | Admitting: Internal Medicine

## 2011-03-03 MED ORDER — CITALOPRAM HYDROBROMIDE 10 MG PO TABS: 10.0000 mg | ORAL_TABLET | Freq: Every day | ORAL | Status: DC

## 2011-03-03 NOTE — Telephone Encounter (Signed)
Refill- citalopram hbr 10mg  tablet. Take one tablet(10mg  total) by mouth daily. Qty 30 last fill 8.10.12

## 2011-03-03 NOTE — Telephone Encounter (Signed)
Rx refill sent to pharmacy. 

## 2011-04-07 ENCOUNTER — Ambulatory Visit (INDEPENDENT_AMBULATORY_CARE_PROVIDER_SITE_OTHER): Payer: MEDICARE | Admitting: Internal Medicine

## 2011-04-07 ENCOUNTER — Encounter: Payer: Self-pay | Admitting: Internal Medicine

## 2011-04-07 DIAGNOSIS — J310 Chronic rhinitis: Secondary | ICD-10-CM | POA: Insufficient documentation

## 2011-04-07 DIAGNOSIS — F0390 Unspecified dementia without behavioral disturbance: Secondary | ICD-10-CM | POA: Insufficient documentation

## 2011-04-07 DIAGNOSIS — R634 Abnormal weight loss: Secondary | ICD-10-CM | POA: Insufficient documentation

## 2011-04-07 DIAGNOSIS — Z79899 Other long term (current) drug therapy: Secondary | ICD-10-CM

## 2011-04-07 DIAGNOSIS — Z23 Encounter for immunization: Secondary | ICD-10-CM

## 2011-04-07 LAB — BASIC METABOLIC PANEL
CO2: 25 mEq/L (ref 19–32)
Chloride: 107 mEq/L (ref 96–112)
Creat: 0.85 mg/dL (ref 0.50–1.35)
Sodium: 144 mEq/L (ref 135–145)

## 2011-04-07 LAB — CBC WITH DIFFERENTIAL/PLATELET
Basophils Absolute: 0 10*3/uL (ref 0.0–0.1)
Eosinophils Relative: 1 % (ref 0–5)
Lymphocytes Relative: 36 % (ref 12–46)
Lymphs Abs: 2 10*3/uL (ref 0.7–4.0)
MCV: 89.4 fL (ref 78.0–100.0)
Neutro Abs: 3 10*3/uL (ref 1.7–7.7)
Neutrophils Relative %: 53 % (ref 43–77)
Platelets: 209 10*3/uL (ref 150–400)
RBC: 4.83 MIL/uL (ref 4.22–5.81)
RDW: 13.5 % (ref 11.5–15.5)
WBC: 5.6 10*3/uL (ref 4.0–10.5)

## 2011-04-07 LAB — HEPATIC FUNCTION PANEL
ALT: 10 U/L (ref 0–53)
Alkaline Phosphatase: 77 U/L (ref 39–117)
Bilirubin, Direct: 0.2 mg/dL (ref 0.0–0.3)
Indirect Bilirubin: 0.6 mg/dL (ref 0.0–0.9)

## 2011-04-07 MED ORDER — FLUTICASONE PROPIONATE 50 MCG/ACT NA SUSP: 2.0000 | Freq: Every day | NASAL | Status: DC

## 2011-04-07 NOTE — Assessment & Plan Note (Signed)
Obtain tsh  

## 2011-04-07 NOTE — Assessment & Plan Note (Signed)
Worsening despite dual therapy. Schedule neurology consult.

## 2011-04-07 NOTE — Assessment & Plan Note (Signed)
Trial of increased remeron 15mg  po qhs. Close observation by family

## 2011-04-07 NOTE — Assessment & Plan Note (Signed)
Rf flonase. Resume medication if sx's do not improve over the next several days.

## 2011-04-07 NOTE — Progress Notes (Signed)
  Subjective:    Patient ID: Nathaniel Stone, male    DOB: 21-Jan-1929, 77 y.o.   MRN: [id number]  HPI Pt presents to clinic for followup of multiple medical problems. H/o dementia maintained on combination of aricept and namenda. Accompanying family member states dementia has been worsening over the past several weeks. Previously saw neurology but has not seen within the past ?year.  No obvious trigger. No infxs s/s. Denies f/c, cough, abd pain, diarrhea, change in urination. Does note mild increase of nasal drainage and rinorrhea x1-2 days. Previously used flonase but not recently. Has worsening insomnia sometimes staying awake until 4-5 am. Sleeping more during the day. Tolerating remeron 7.5mg  qhs. No other complaints.  Past Medical History  Diagnosis Date  . GERD (gastroesophageal reflux disease)   . Hypertension   . History of GI bleed     multiple distal duodenal and general diverticula  . Dementia   . Glaucoma   . CAD (coronary artery disease)   . Hyperlipidemia    No past surgical history on file.  reports that he has quit smoking. He does not have any smokeless tobacco history on file. His alcohol and drug histories not on file. family history includes Cancer in his son. No Known Allergies   Review of Systems see hpi     Objective:   Physical Exam  Physical Exam  Nursing note and vitals reviewed. Constitutional: Appears well-developed and well-nourished. No distress.  HENT:  Head: Normocephalic and atraumatic.  Right Ear: External ear normal.  Left Ear: External ear normal.  Eyes: Conjunctivae are normal. No scleral icterus.  Neck: Neck supple. Carotid bruit is not present.  Cardiovascular: Normal rate, regular rhythm and normal heart sounds.  Exam reveals no gallop and no friction rub.   No murmur heard. Pulmonary/Chest: Effort normal and breath sounds normal. No respiratory distress. He has no wheezes. no rales.  Lymphadenopathy:    He has no cervical adenopathy.    Neurological:Alert.  Skin: Skin is warm and dry. Not diaphoretic.  Psychiatric: Has a normal mood and affect.        Assessment & Plan:

## 2011-04-08 ENCOUNTER — Encounter: Payer: Self-pay | Admitting: Neurology

## 2011-04-20 ENCOUNTER — Ambulatory Visit (INDEPENDENT_AMBULATORY_CARE_PROVIDER_SITE_OTHER): Payer: MEDICARE | Admitting: Neurology

## 2011-04-20 ENCOUNTER — Encounter: Payer: Self-pay | Admitting: Neurology

## 2011-04-20 VITALS — BP 98/56 | HR 56 | Ht 68.5 in | Wt 137.0 lb

## 2011-04-20 DIAGNOSIS — F0390 Unspecified dementia without behavioral disturbance: Secondary | ICD-10-CM

## 2011-04-20 NOTE — Patient Instructions (Signed)
Your CT scan is scheduled for Thursday, October 25th at 2:30pm.  Please arrive to [facility] by 2:15pm.  We will call you with the results and with a follow up appointment to see Dr. Modesto Charon.

## 2011-04-20 NOTE — Progress Notes (Signed)
Dear Dr. Rodena Medin,  Thank you for having me see Nathaniel Stone in consultation today at [facility] for his problem with memory loss.  As you may recall, he is a 77 y.o. year old male with a history of dementia for several years although this has gotten significantly worse over the last several months.  His youngest son who accompanies him says that he has had progressive worsening of memory.  He is now having problems taking care of his BADLs such as cleaning himself after defecating.  He has word finding difficulties, difficulty remembering conversations, and repeat questioning.  He denies hallucinations, or bladder incontinence.  He has some instability of gait but this does not seem to be a major issue.  He was put on Namenda and Donepezil over the last several years and this has not seemed to appreciably help.  He does get argumentative and agitated particularly with his daughter with whom he lives.  He has not had any violent behavior.  He does seem to be getting disoriented and confused more often.  He has day night reversal.    He also has significant problems with his mood as his wife of 62 years died in 05-12-2023.  He has frequent tearful periods.  Past Medical History  Diagnosis Date  . GERD (gastroesophageal reflux disease)   . Hypertension   . History of GI bleed     multiple distal duodenal and general diverticula  . Dementia   . Glaucoma   . CAD (coronary artery disease)   . Hyperlipidemia   No history of stroke, seizure, head trauma or seizure.  Past Surgical History  Procedure Date  . Cholecystectomy   . Back surgery     History   Social History  . Marital Status: Married    Spouse Name: N/A    Number of Children: 4  . Years of Education: N/A   Occupational History  . Retired Corporate investment banker    Social History Main Topics  . Smoking status: Former Games developer  . Smokeless tobacco: Never Used  . Alcohol Use: None  . Drug Use: None  . Sexually Active: None   Other  Topics Concern  . None   Social History Narrative  . None    Family History  Problem Relation Age of Onset  . Cancer Son     kidney   Sister has dementia.  Multiple brother who are deceased had dementia.   ROS:  13 systems were reviewed and are notable for frequent nasal drainage, nausea and vomiting as well as ulcers and gastritis.    He also gets arm and leg pain and has arthritis.  All other review of systems are unremarkable.   Examination:  Filed Vitals:   04/20/11 1029  BP: 98/56  Pulse: 56  Height: 5' 8.5" (1.74 m)  Weight: 137 lb (62.143 kg)     In general, well appearing thin older man.  Cardiovascular: The patient has a regular rate and rhythm and no carotid bruits.  Fundoscopy:  Disks are flat.   Mental status:   6/27 on MMSE(did not test reading sentence, writing sentence or diagrams.)  Got 1 point on city, 2 on counting backwards from 10, 3 on registration.  Cranial Nerves: Pupils are equally round and reactive to light. Visual fields full to confrontation. Extraocular movements are intact without nystagmus. Facial sensation and muscles of mastication are intact. Muscles of facial expression are symmetric. Hearing intact to bilateral finger rub. Tongue protrusion, uvula, palate midline.  Shoulder shrug intact  Motor:  The patient has normal bulk and tone, no pronator drift.  There are no adventitious movements.  5/5  Reflexes:  Quiet throughout.  Toes down  Coordination:  Normal finger to nose.    Sensation is intact to light touch bilaterally.  Gait and Station are normal.     FRS: PM-; snout+; glabellar +; no grasp   Impression: Dementia, likely Alzheimer's type.  Recommendations: I am going to get a CT of his head in order to make sure he does not have something else to explain his dementia such as NPH, although I think this is unlikely.  He is maximized on the two medications available to him and I don't think another cholinesterase  inhibitor is going to make a difference.  The key going forward is to manage behaviors.  I talked to them about not trying to confront him directly if he wants to do something(or doesn't want to do something) but rather change the subject if at all possible. He may need a medication like Seroquel if agitation gets to be a bigger issue but I would not do this now.  They have looked into a day program and we are going to give them information on the [facility] program as well.    If wandering becomes a significant issue I would consider getting him a medic-alert bracelet with his personal information.  If his mood continue to be a problem I would consider a referral to a psychiatrist, preferably geriatric for further treatment.   We will see the patient back in 6 months to evaluate how he is doing and to see if other medication for behavior is necessary.  Thank you for having [place] see Nathaniel Stone in consultation.  Feel free to contact me with any questions.  Lupita Raider Modesto Charon, MD [facility],  [address] [place], [place] [address] Phone: [phone] Fax: [phone].

## 2011-04-22 ENCOUNTER — Ambulatory Visit (HOSPITAL_COMMUNITY)
Admission: RE | Admit: 2011-04-22 | Discharge: 2011-04-22 | Disposition: A | Discharge status: Home / Self Care | Payer: MEDICARE | Source: Ambulatory Visit | Attending: Neurology | Admitting: Neurology

## 2011-04-22 DIAGNOSIS — F0390 Unspecified dementia without behavioral disturbance: Secondary | ICD-10-CM | POA: Insufficient documentation

## 2011-04-22 DIAGNOSIS — G319 Degenerative disease of nervous system, unspecified: Secondary | ICD-10-CM | POA: Insufficient documentation

## 2011-04-24 ENCOUNTER — Encounter: Payer: Self-pay | Admitting: Neurology

## 2011-04-27 ENCOUNTER — Other Ambulatory Visit: Payer: Self-pay | Admitting: *Deleted

## 2011-04-27 DIAGNOSIS — F068 Other specified mental disorders due to known physiological condition: Secondary | ICD-10-CM

## 2011-04-27 MED ORDER — MIRTAZAPINE 15 MG PO TABS: 15.0000 mg | ORAL_TABLET | Freq: Every day | ORAL | Status: DC

## 2011-04-27 NOTE — Telephone Encounter (Signed)
Patients son called and left voice message stating he was advised to increase patients medication to a full dose, and that seems to be working well. He stated that went to get a new Rx from the pharmacy and was informed an updated Rx is needed from our office. His message stated that he could not remember the name of the medication, but needed a Rx sent to the pharmacy.  After review of last office visit note. Remeron was changed form 7.5 mg to 15 mg on a trial basis. New Rx sent to pharmacy for 15 mg. Follow up appointment on file for January 19th at 10:30 am with Dr Rodena Medin.

## 2011-05-14 ENCOUNTER — Telehealth: Payer: Self-pay | Admitting: Internal Medicine

## 2011-05-14 MED ORDER — OMEPRAZOLE 20 MG PO CPDR: 20.0000 mg | DELAYED_RELEASE_CAPSULE | Freq: Every day | ORAL | Status: DC

## 2011-05-14 NOTE — Telephone Encounter (Signed)
Rx refill sent to pharmacy. 

## 2011-05-14 NOTE — Telephone Encounter (Signed)
Refill omeprazole dr 20 mg cap last fill 10=17-2012 qty 30

## 2011-06-13 ENCOUNTER — Other Ambulatory Visit: Payer: Self-pay | Admitting: Family

## 2011-06-14 ENCOUNTER — Telehealth: Payer: Self-pay | Admitting: Internal Medicine

## 2011-06-14 MED ORDER — MEMANTINE HCL 10 MG PO TABS: 5.0000 mg | ORAL_TABLET | Freq: Two times a day (BID) | ORAL | Status: DC

## 2011-06-14 NOTE — Telephone Encounter (Signed)
Medication interaction is coming up that concomitant use of omeprazole and celexa may cause QT prolongation. Is it ok to send refill?

## 2011-06-14 NOTE — Telephone Encounter (Signed)
Refill- namenda 10mg  tablet. Take 0.5 tablets(5mg  total) by mouth two times daily. Qty 30 last fill 11.16.12

## 2011-06-14 NOTE — Telephone Encounter (Signed)
ok 

## 2011-06-14 NOTE — Telephone Encounter (Signed)
Rx refill sent to pharmacy. 

## 2011-06-30 ENCOUNTER — Other Ambulatory Visit: Payer: Self-pay | Admitting: Family

## 2011-06-30 NOTE — Telephone Encounter (Signed)
Received refill request from [facility] for Remeron 15mg  1/2 tablet daily. Per 04/07/11 office note pt was increased to 15mg  daily.  Verified with Gershon Cull that pt is taking 15mg  a day. Refill sent to pharmacy for 15mg  #30 x 1 refill.

## 2011-07-07 ENCOUNTER — Ambulatory Visit: Payer: MEDICARE | Admitting: Internal Medicine

## 2011-07-07 ENCOUNTER — Ambulatory Visit (INDEPENDENT_AMBULATORY_CARE_PROVIDER_SITE_OTHER): Payer: MEDICARE | Admitting: Internal Medicine

## 2011-07-07 ENCOUNTER — Encounter: Payer: Self-pay | Admitting: Internal Medicine

## 2011-07-07 DIAGNOSIS — F068 Other specified mental disorders due to known physiological condition: Secondary | ICD-10-CM

## 2011-07-07 MED ORDER — CITALOPRAM HYDROBROMIDE 10 MG PO TABS: 20.0000 mg | ORAL_TABLET | Freq: Every day | ORAL | Status: DC

## 2011-07-07 MED ORDER — MIRTAZAPINE 30 MG PO TABS: 30.0000 mg | ORAL_TABLET | Freq: Every day | ORAL | Status: DC

## 2011-07-10 NOTE — Progress Notes (Signed)
  Subjective:    Patient ID: Nathaniel Stone, male    DOB: 11/14/1928, 77 y.o.   MRN: [id number]  HPI Pt presents to clinic for followup of multiple medical problems. H/o dementia followed by neurology maintained on dual therapy. Sx's worsening with some intermittent agitation. Acted out towards a family member without injury to self or other. Son feels pt still safe at home. Has some depressed mood and distutbed sleep. Wt down 2lbs since last visit. Total time of visit ~10minutes of which >50% spent in counseling.  Past Medical History  Diagnosis Date  . GERD (gastroesophageal reflux disease)   . Hypertension   . History of GI bleed     multiple distal duodenal and general diverticula  . Dementia   . Glaucoma   . CAD (coronary artery disease)   . Hyperlipidemia    Past Surgical History  Procedure Date  . Cholecystectomy   . Back surgery     reports that he has quit smoking. He has never used smokeless tobacco. His alcohol and drug histories not on file. family history includes Cancer in his son. No Known Allergies    Review of Systems see hpi     Objective:   Physical Exam  Nursing note and vitals reviewed. Constitutional: He appears well-developed and well-nourished. No distress.  HENT:  Head: Normocephalic and atraumatic.  Eyes: Conjunctivae are normal. No scleral icterus.  Cardiovascular: Normal rate, regular rhythm and normal heart sounds.   Pulmonary/Chest: Effort normal and breath sounds normal. No respiratory distress. He has no wheezes. He has no rales.  Neurological: He is alert.  Skin: Skin is warm and dry. He is not diaphoretic.  Psychiatric: He has a normal mood and affect.          Assessment & Plan:

## 2011-07-10 NOTE — Assessment & Plan Note (Signed)
Progressing. Possible contribution from depression and sleep disturbance. Increase remeron and celexa.

## 2011-07-14 ENCOUNTER — Telehealth: Payer: Self-pay | Admitting: *Deleted

## 2011-07-14 DIAGNOSIS — F068 Other specified mental disorders due to known physiological condition: Secondary | ICD-10-CM

## 2011-07-14 NOTE — Telephone Encounter (Signed)
Ok to assess

## 2011-07-14 NOTE — Telephone Encounter (Signed)
Patients son called and left voice message stating he was advised by social services to check with patients doctor to have a referral initiated for home health services. He would like to know if Dr Rodena Medin would initiate a referral, because the patient is unable to be left in his home alone.

## 2011-07-14 NOTE — Telephone Encounter (Signed)
Call placed to patient's son at [phone], no answer. A voice message was left informing home health referral initiated.

## 2011-08-06 ENCOUNTER — Other Ambulatory Visit: Payer: Self-pay | Admitting: Family

## 2011-08-06 MED ORDER — ALPRAZOLAM 0.25 MG PO TABS: 0.2500 mg | ORAL_TABLET | Freq: Three times a day (TID) | ORAL | Status: AC | PRN

## 2011-08-06 NOTE — Telephone Encounter (Signed)
Previously received message from pt's son that pt has become more aggressive and combative over the last 2 weeks. Family is having very difficult time caring for pt. Notified Roderick per Melissa's direction below and he requested rx be called to [facility] on [address]. Rx called [facility]. Valarie Merino that appt will be needed with Dr Rodena Medin to discuss further management next week. Appt scheduled for 08/12/11 at 3:15pm.

## 2011-08-06 NOTE — Telephone Encounter (Signed)
    Will add prn alprazolam to be used sparingly under supervision of family.

## 2011-08-12 ENCOUNTER — Encounter (HOSPITAL_BASED_OUTPATIENT_CLINIC_OR_DEPARTMENT_OTHER): Payer: Self-pay | Admitting: *Deleted

## 2011-08-12 ENCOUNTER — Other Ambulatory Visit: Payer: Self-pay

## 2011-08-12 ENCOUNTER — Emergency Department (HOSPITAL_BASED_OUTPATIENT_CLINIC_OR_DEPARTMENT_OTHER)
Admission: EM | Admit: 2011-08-12 | Discharge: 2011-08-13 | Disposition: A | Discharge status: Other Acute Inpatient Hospital | Payer: MEDICARE | Attending: Emergency Medicine | Admitting: Emergency Medicine

## 2011-08-12 ENCOUNTER — Emergency Department (INDEPENDENT_AMBULATORY_CARE_PROVIDER_SITE_OTHER): Payer: MEDICARE

## 2011-08-12 ENCOUNTER — Encounter: Payer: Self-pay | Admitting: Internal Medicine

## 2011-08-12 ENCOUNTER — Ambulatory Visit (INDEPENDENT_AMBULATORY_CARE_PROVIDER_SITE_OTHER): Payer: MEDICARE | Admitting: Internal Medicine

## 2011-08-12 VITALS — BP 100/60 | HR 62 | Temp 97.5°F | Resp 16

## 2011-08-12 DIAGNOSIS — R4182 Altered mental status, unspecified: Secondary | ICD-10-CM

## 2011-08-12 DIAGNOSIS — F603 Borderline personality disorder: Secondary | ICD-10-CM | POA: Insufficient documentation

## 2011-08-12 DIAGNOSIS — K219 Gastro-esophageal reflux disease without esophagitis: Secondary | ICD-10-CM | POA: Insufficient documentation

## 2011-08-12 DIAGNOSIS — E785 Hyperlipidemia, unspecified: Secondary | ICD-10-CM | POA: Insufficient documentation

## 2011-08-12 DIAGNOSIS — F0390 Unspecified dementia without behavioral disturbance: Secondary | ICD-10-CM | POA: Insufficient documentation

## 2011-08-12 DIAGNOSIS — G309 Alzheimer's disease, unspecified: Secondary | ICD-10-CM | POA: Insufficient documentation

## 2011-08-12 DIAGNOSIS — R4689 Other symptoms and signs involving appearance and behavior: Secondary | ICD-10-CM

## 2011-08-12 DIAGNOSIS — Z79899 Other long term (current) drug therapy: Secondary | ICD-10-CM | POA: Insufficient documentation

## 2011-08-12 DIAGNOSIS — I2510 Atherosclerotic heart disease of native coronary artery without angina pectoris: Secondary | ICD-10-CM | POA: Insufficient documentation

## 2011-08-12 DIAGNOSIS — F0280 Dementia in other diseases classified elsewhere without behavioral disturbance: Secondary | ICD-10-CM | POA: Insufficient documentation

## 2011-08-12 DIAGNOSIS — F068 Other specified mental disorders due to known physiological condition: Secondary | ICD-10-CM

## 2011-08-12 DIAGNOSIS — I10 Essential (primary) hypertension: Secondary | ICD-10-CM | POA: Insufficient documentation

## 2011-08-12 DIAGNOSIS — G9389 Other specified disorders of brain: Secondary | ICD-10-CM | POA: Insufficient documentation

## 2011-08-12 DIAGNOSIS — Z7901 Long term (current) use of anticoagulants: Secondary | ICD-10-CM | POA: Insufficient documentation

## 2011-08-12 HISTORY — DX: Alzheimer's disease, unspecified: G30.9

## 2011-08-12 HISTORY — DX: Dementia in other diseases classified elsewhere, unspecified severity, without behavioral disturbance, psychotic disturbance, mood disturbance, and anxiety: F02.80

## 2011-08-12 LAB — SALICYLATE LEVEL: Salicylate Lvl: <2 mg/dL — ABNORMAL LOW (ref 2.8–20.0)

## 2011-08-12 LAB — URINALYSIS, ROUTINE W REFLEX MICROSCOPIC
Hgb urine dipstick: NEGATIVE
Ketones, ur: 15 mg/dL — AB
Nitrite: NEGATIVE
Protein, ur: NEGATIVE mg/dL
Urobilinogen, UA: 4 mg/dL — ABNORMAL HIGH (ref 0.0–1.0)

## 2011-08-12 LAB — DIFFERENTIAL
Basophils Absolute: 0 10*3/uL (ref 0.0–0.1)
Basophils Relative: 0 % (ref 0–1)
Eosinophils Relative: 2 % (ref 0–5)
Monocytes Absolute: 0.4 10*3/uL (ref 0.1–1.0)
Neutro Abs: 2.7 10*3/uL (ref 1.7–7.7)

## 2011-08-12 LAB — CBC
HCT: 40.6 % (ref 39.0–52.0)
MCHC: 34.2 g/dL (ref 30.0–36.0)
RDW: 13.6 % (ref 11.5–15.5)

## 2011-08-12 LAB — COMPREHENSIVE METABOLIC PANEL
Albumin: 3.8 g/dL (ref 3.5–5.2)
Alkaline Phosphatase: 101 U/L (ref 39–117)
BUN: 22 mg/dL (ref 6–23)
Calcium: 9.8 mg/dL (ref 8.4–10.5)
Creatinine, Ser: 0.9 mg/dL (ref 0.50–1.35)
GFR calc Af Amer: >90 mL/min (ref >90)
GFR calc non Af Amer: 78 mL/min — ABNORMAL LOW (ref >90)
Potassium: 3.6 mEq/L (ref 3.5–5.1)
Total Bilirubin: 0.4 mg/dL (ref 0.3–1.2)
Total Protein: 6.9 g/dL (ref 6.0–8.3)

## 2011-08-12 LAB — RAPID URINE DRUG SCREEN, HOSP PERFORMED
Amphetamines: NOT DETECTED
Benzodiazepines: NOT DETECTED
Cocaine: NOT DETECTED
Opiates: NOT DETECTED

## 2011-08-12 LAB — PROTIME-INR: INR: 1.06 (ref 0.00–1.49)

## 2011-08-12 LAB — ACETAMINOPHEN LEVEL: Acetaminophen (Tylenol), Serum: <15 ug/mL (ref 10–30)

## 2011-08-12 MED ORDER — HALOPERIDOL 5 MG PO TABS
5.0000 mg | ORAL_TABLET | Freq: Two times a day (BID) | ORAL | Status: DC
  Administered 2011-08-13: 5 mg via ORAL
  Filled 2011-08-12: qty 1

## 2011-08-12 MED ORDER — ZIPRASIDONE MESYLATE 20 MG IM SOLR
10.0000 mg | Freq: Once | INTRAMUSCULAR | Status: AC
  Administered 2011-08-12: 10 mg via INTRAMUSCULAR
  Filled 2011-08-12: qty 20

## 2011-08-12 MED ORDER — NICOTINE 21 MG/24HR TD PT24
21.0000 mg | MEDICATED_PATCH | Freq: Every day | TRANSDERMAL | Status: DC
  Filled 2011-08-12: qty 1

## 2011-08-12 MED ORDER — ZIPRASIDONE HCL 20 MG PO CAPS
20.0000 mg | ORAL_CAPSULE | Freq: Two times a day (BID) | ORAL | Status: DC
  Filled 2011-08-12: qty 1

## 2011-08-12 MED ORDER — PANTOPRAZOLE SODIUM 40 MG PO TBEC
40.0000 mg | DELAYED_RELEASE_TABLET | Freq: Every day | ORAL | Status: DC
  Administered 2011-08-13: 40 mg via ORAL
  Filled 2011-08-12: qty 1

## 2011-08-12 NOTE — ED Provider Notes (Signed)
History     CSN: [id number]  Arrival date & time 08/12/11  1647   None     Chief Complaint  Patient presents with  . Medical Clearance    (Consider location/radiation/quality/duration/timing/severity/associated sxs/prior treatment) Patient is a 78 y.o. male presenting with altered mental status. The history is provided by a relative. No language interpreter was used.  Altered Mental Status This is a recurrent problem. The current episode started more than 2 days ago (Patient's sons say that he has dementia, and has been living at almost 4 of them. He is constantly supervised by family members. However, he has become more aggressive recently, and also has been wandering from the house. They described a situation where h). The problem occurs constantly. The problem has been gradually worsening. The symptoms are aggravated by nothing. The symptoms are relieved by nothing. Treatments tried: He has been taking his regular medicines, without relief.    Past Medical History  Diagnosis Date  . GERD (gastroesophageal reflux disease)   . Hypertension   . History of GI bleed     multiple distal duodenal and general diverticula  . Dementia   . Glaucoma   . CAD (coronary artery disease)   . Hyperlipidemia   . Alzheimer disease     Past Surgical History  Procedure Date  . Cholecystectomy   . Back surgery     Family History  Problem Relation Age of Onset  . Cancer Son     kidney    History  Substance Use Topics  . Smoking status: Former Games developer  . Smokeless tobacco: Never Used  . Alcohol Use: No      Review of Systems  Unable to perform ROS: Dementia  Psychiatric/Behavioral: Positive for altered mental status.    Allergies  Review of patient's allergies indicates no known allergies.  Home Medications   Current Outpatient Rx  Name Route Sig Dispense Refill  . ALPRAZOLAM 0.25 MG PO TABS Oral Take 1 tablet (0.25 mg total) by mouth 3 (three) times daily as needed for  sleep. 20 tablet 0  . CETIRIZINE HCL 10 MG PO TABS Oral Take 10 mg by mouth daily.      Marland Kitchen CITALOPRAM HYDROBROMIDE 10 MG PO TABS Oral Take 2 tablets (20 mg total) by mouth daily. 30 tablet 6  . DIPHENHYDRAMINE-APAP (SLEEP) 25-500 MG PO TABS Oral Take 1 tablet by mouth at bedtime as needed. For sleep    . DONEPEZIL HCL 10 MG PO TABS Oral Take 1 tablet (10 mg total) by mouth at bedtime. 30 tablet 5  . DORZOLAMIDE HCL-TIMOLOL MAL 22.3-6.8 MG/ML OP SOLN Both Eyes Place 1 drop into both eyes 2 (two) times daily.      Marland Kitchen FLUTICASONE PROPIONATE 50 MCG/ACT NA SUSP Nasal Place 2 sprays into the nose daily. 16 g 3  . MEMANTINE HCL 10 MG PO TABS Oral Take 0.5 tablets (5 mg total) by mouth 2 (two) times daily. 60 tablet 5  . MIRTAZAPINE 30 MG PO TABS Oral Take 1 tablet (30 mg total) by mouth at bedtime. 30 tablet 6  . OMEPRAZOLE 20 MG PO CPDR Oral Take 20 mg by mouth every morning. Before morning meal.    . TRAVOPROST 0.004 % OP SOLN Both Eyes Place 1 drop into both eyes at bedtime.      Marland Kitchen VITAMIN B-12 1000 MCG PO TABS Oral Take 1,000 mcg by mouth 3 (three) times daily.        BP 124/64  Pulse 72  Temp(Src) 97.7 F (36.5 C) (Oral)  Resp 22  Wt 131 lb (59.421 kg)  SpO2 100%  Physical Exam  Nursing note and vitals reviewed. Constitutional:       Slender elderly man in no distress.  HENT:  Head: Normocephalic and atraumatic.  Right Ear: External ear normal.  Left Ear: External ear normal.  Mouth/Throat: Oropharynx is clear and moist.  Eyes: Conjunctivae and EOM are normal. Pupils are equal, round, and reactive to light.  Neck: Normal range of motion. Neck supple.  Cardiovascular: Normal rate, regular rhythm and normal heart sounds.   Pulmonary/Chest: Effort normal and breath sounds normal.  Abdominal: Soft. Bowel sounds are normal. He exhibits no distension. There is no tenderness.  Musculoskeletal: Normal range of motion. He exhibits no edema and no tenderness.  Neurological:       Awake,  oriented to person only, no sensory or motor deficits.  Skin: Skin is warm and dry.  Psychiatric:       Sits quietly, not speaking, but occasionally will say something that has a religious tone to it.    ED Course  Procedures (including critical care time)   5:40 PM Pt seen --> physical exam performed.  Lab workup ordered.  [facility] Crisis evaluation requested.    7:20 PM Pt uncooperative in X-ray.  Will medicate with Geodon 10 mg IM, and retry in 30 minutes.  8:09 PM Results for orders placed during the hospital encounter of 08/12/11  CBC      Component Value Range   WBC 4.0  4.0 - 10.5 (K/uL)   RBC 4.63  4.22 - 5.81 (MIL/uL)   Hemoglobin 13.9  13.0 - 17.0 (g/dL)   HCT 16.1  09.6 - 04.5 (%)   MCV 87.7  78.0 - 100.0 (fL)   MCH 30.0  26.0 - 34.0 (pg)   MCHC 34.2  30.0 - 36.0 (g/dL)   RDW 40.9  81.1 - 91.4 (%)   Platelets    150 - 400 (K/uL)   Value: PLATELET CLUMPS NOTED ON SMEAR, COUNT APPEARS ADEQUATE  DIFFERENTIAL      Component Value Range   Neutrophils Relative 67  43 - 77 (%)   Neutro Abs 2.7  1.7 - 7.7 (K/uL)   Lymphocytes Relative 23  12 - 46 (%)   Lymphs Abs 0.9  0.7 - 4.0 (K/uL)   Monocytes Relative 9  3 - 12 (%)   Monocytes Absolute 0.4  0.1 - 1.0 (K/uL)   Eosinophils Relative 2  0 - 5 (%)   Eosinophils Absolute 0.1  0.0 - 0.7 (K/uL)   Basophils Relative 0  0 - 1 (%)   Basophils Absolute 0.0  0.0 - 0.1 (K/uL)   WBC Morphology VACUOLATED NEUTROPHILS    COMPREHENSIVE METABOLIC PANEL      Component Value Range   Sodium 145  135 - 145 (mEq/L)   Potassium 3.6  3.5 - 5.1 (mEq/L)   Chloride 106  96 - 112 (mEq/L)   CO2 28  19 - 32 (mEq/L)   Glucose, Bld 89  70 - 99 (mg/dL)   BUN 22  6 - 23 (mg/dL)   Creatinine, Ser 7.82  0.50 - 1.35 (mg/dL)   Calcium 9.8  8.4 - 95.6 (mg/dL)   Total Protein 6.9  6.0 - 8.3 (g/dL)   Albumin 3.8  3.5 - 5.2 (g/dL)   AST 14  0 - 37 (U/L)   ALT 9  0 - 53 (U/L)   Alkaline Phosphatase 101  39 - 117 (U/L)   Total Bilirubin 0.4  0.3 -  1.2 (mg/dL)   GFR calc non Af Amer 78 (*) >90 (mL/min)   GFR calc Af Amer >90  >90 (mL/min)  URINALYSIS, ROUTINE W REFLEX MICROSCOPIC      Component Value Range   Color, Urine AMBER (*) YELLOW    APPearance CLEAR  CLEAR    Specific Gravity, Urine 1.029  1.005 - 1.030    pH 7.0  5.0 - 8.0    Glucose, UA NEGATIVE  NEGATIVE (mg/dL)   Hgb urine dipstick NEGATIVE  NEGATIVE    Bilirubin Urine SMALL (*) NEGATIVE    Ketones, ur 15 (*) NEGATIVE (mg/dL)   Protein, ur NEGATIVE  NEGATIVE (mg/dL)   Urobilinogen, UA 4.0 (*) 0.0 - 1.0 (mg/dL)   Nitrite NEGATIVE  NEGATIVE    Leukocytes, UA NEGATIVE  NEGATIVE   PROTIME-INR      Component Value Range   Prothrombin Time 14.0  11.6 - 15.2 (seconds)   INR 1.06  0.00 - 1.49   APTT      Component Value Range   aPTT 27  24 - 37 (seconds)  ETHANOL      Component Value Range   Alcohol, Ethyl (B) <11  0 - 11 (mg/dL)  ACETAMINOPHEN LEVEL      Component Value Range   Acetaminophen (Tylenol), Serum <15.0  10 - 30 (ug/mL)  SALICYLATE LEVEL      Component Value Range   Salicylate Lvl <2.0 (*) 2.8 - 20.0 (mg/dL)  URINE RAPID DRUG SCREEN (HOSP PERFORMED)      Component Value Range   Opiates NONE DETECTED  NONE DETECTED    Cocaine NONE DETECTED  NONE DETECTED    Benzodiazepines NONE DETECTED  NONE DETECTED    Amphetamines NONE DETECTED  NONE DETECTED    Tetrahydrocannabinol NONE DETECTED  NONE DETECTED    Barbiturates NONE DETECTED  NONE DETECTED    Dg Chest 2 View  08/12/2011  *RADIOLOGY REPORT*  Clinical Data: Altered mental status.  CHEST - 2 VIEW  Comparison: 02/16/2010.  Findings: The cardiac silhouette, mediastinal and hilar contours are within normal limits and stable.  The lungs are clear.  No pleural effusion.  The bony thorax is intact.  IMPRESSION: No acute cardiopulmonary findings.  Original Report Authenticated By: P. Loralie Champagne, M.D.   Ct Head Wo Contrast  08/12/2011  *RADIOLOGY REPORT*  Clinical Data: Altered mental status, aggressive  behavior  CT HEAD WITHOUT CONTRAST  Technique:  Contiguous axial images were obtained from the base of the skull through the vertex without contrast.  Comparison: 04/22/2011  Findings: Grossly unchanged periventricular hypodensities compatible microvascular ischemic disease.  No CT evidence of acute large territory infarct.  No intraparenchymal or extra-axial mass or hemorrhage.  Unchanged size and configuration of the ventricles and basilar cisterns.  No midline shift.  Post left-sided cataract surgery.  Right-sided spiral buckles.  Paranasal sinuses and mastoid air cells are normal.  Regional soft tissues are normal.  IMPRESSION: Stable findings of microvascular ischemic disease without acute intracranial process.  Original Report Authenticated By: Waynard Reeds, M.D.     Lab workup entirely negative.  Medically cleared for psychiatric treatment.    Date: 08/12/2011  Rate: 80  Rhythm: normal sinus rhythm  QRS Axis: normal  Intervals: normal  ST/T Wave abnormalities: normal  Conduction Disutrbances:none  Narrative Interpretation: Normal EKG  Old EKG Reviewed: none available    1. Dementia   2. Aggressive behavior  11:38 PM Pt has been seen by [facility].  Involuntary commitment papers have been completed.  Attempts are being made to arrange for inpatient psychiatric hospitalization for this patient.       Carleene Cooper III, MD 08/12/11 [phone]

## 2011-08-12 NOTE — ED Notes (Signed)
Son states patient has had a two week history of increased confusion and combativeness.  Was seen by by Dr. Rodena Medin today and sent here for evaluation and placement in psych unit or nursing home.  Has been cared for at home by his daughter with lung cancer and has not been taking his medications properly.

## 2011-08-12 NOTE — ED Notes (Signed)
Pt aggitated, unable to obtain CT and X-ray, will admin Geodon per Dr Ignacia Palma, family remains at bedside, pt ambulated and re-directed

## 2011-08-12 NOTE — ED Notes (Signed)
Jamie from [facility] at bedside

## 2011-08-12 NOTE — ED Notes (Signed)
Pt calm and sitting on bed with MD at bedside, family states pt has been increasingly aggressive at home and has been wandering more than usual, also states that pt's daughter is having difficulty caring for him at home

## 2011-08-13 ENCOUNTER — Other Ambulatory Visit: Payer: Self-pay

## 2011-08-13 MED ORDER — ZIPRASIDONE MESYLATE 20 MG IM SOLR: 10.0000 mg | Freq: Once | INTRAMUSCULAR | Status: DC

## 2011-08-13 MED ORDER — HALOPERIDOL LACTATE 5 MG/ML IJ SOLN
5.0000 mg | Freq: Once | INTRAMUSCULAR | Status: AC
  Administered 2011-08-13: 5 mg via INTRAMUSCULAR

## 2011-08-13 MED ORDER — HALOPERIDOL LACTATE 5 MG/ML IJ SOLN
INTRAMUSCULAR | Status: AC
  Filled 2011-08-13: qty 1

## 2011-08-13 MED ORDER — HALOPERIDOL LACTATE 5 MG/ML IJ SOLN
INTRAMUSCULAR | Status: AC
  Administered 2011-08-13: 5 mg
  Filled 2011-08-13: qty 1

## 2011-08-13 NOTE — ED Notes (Signed)
[facility] returned call  And refuses pt.

## 2011-08-13 NOTE — ED Notes (Signed)
Spoke with [facility] regarding placement- [facility] to be in ED this am.

## 2011-08-13 NOTE — ED Notes (Signed)
Spoke with pt's family, made aware of plan of care. Pt remains asleep, continuous pulse ox in place, PO 99% on room air, SR up x2.

## 2011-08-13 NOTE — ED Notes (Signed)
Pt resting with eyes closed, resps even and unlabored, family at bs, SR up x2.

## 2011-08-13 NOTE — ED Notes (Signed)
Pt's son at bedside, given recliner and updated on plan for pt

## 2011-08-13 NOTE — ED Notes (Signed)
rec'd call from [facility] with mobile crisis requesting additional documentation to be faxed to [facility]. Documentation faxed and awaiting return call for possible acceptance.

## 2011-08-13 NOTE — ED Provider Notes (Signed)
Filed Vitals:   08/13/11 0919  BP: 92/45  Pulse: 61  Temp: 98.9 F (37.2 C)  Resp: 16    Patient has been seen by [facility]. He remained stable. EKG was performed for medical clearance for acceptance.   Date: 08/13/2011  Rate: 57  Rhythm: sinus bradycardia  QRS Axis: normal  Intervals: normal  ST/T Wave abnormalities: normal  Conduction Disutrbances:none  Narrative Interpretation:   Old EKG Reviewed: unchanged      Cyndra Numbers, MD 08/13/11 1346

## 2011-08-13 NOTE — ED Notes (Signed)
Report received from Jodell Cipro, RN, care assumed. Pt sleeping and resting on left lateral side.  Respirations even and unlabored. Skin warm and dry.  Son at bedside and informed of plan of care.  Awaiting placement and [facility] re-evaluation this am.

## 2011-08-13 NOTE — ED Provider Notes (Signed)
Patient agitated and aggressive. Additional Haldol given. Accepted at the [facility] by Dr. Christell Constant.  BP 115/50  Pulse 74  Temp(Src) 97.9 F (36.6 C) (Oral)  Resp 16  Wt 131 lb (59.421 kg)  SpO2 100%   Glynn Octave, MD 08/13/11 [phone]

## 2011-08-13 NOTE — ED Notes (Signed)
Spoke with Nathaniel Stone at [facility] x3 regarding incomplete paperwork regarding petition of pt. Call placed again to [facility] with mobile crisis to inform him that paperwork was incomplete. Marijean Niemann stated that he would pass it along at change of shift to allow for further follow-up.

## 2011-08-13 NOTE — ED Notes (Signed)
Events since 1640: son came running from room ,shouting he needed help,running behind him was the pt.  Several staff stood in front of him and he stopped.  He grabbed my hand and wouldn't let go, we finally got him back into his room.   He was given  Haldol IM,approx 15 to 20 min later he was calm.  The family has been here constantly since admission yesterday.   When notified he would be going to [facility] were very upset.  I spoke with the EDP ,who would not change the papers  And would not agree to let him stay here another night in hopes of a closer bed.  I also called the crisis team back and they stated he must go to [facility].  The pt has been given a snack meal and has been tolerating po flds very well.  Slept for awhile after being medicated then awake and sat on side of bed.  Several conversations with the family. 1830  Sheriff here to transport.  At 1915 haldol 5mg  IM given  For transport.

## 2011-08-16 ENCOUNTER — Telehealth: Payer: Self-pay | Admitting: Internal Medicine

## 2011-08-16 NOTE — Telephone Encounter (Signed)
Patients son called stating patient was admitted to a hospital in [place]. He is requesting information on how to obtain a healthcare power of attorney. He has concerns that his father is too far from home, and they would like to have him moved closer. Nathaniel Stone was informed that I would look into obtaining the paperwork for him, however this is something that is initiated by the family. He has verbalized understanding and will await on the return phone call.

## 2011-08-17 NOTE — Progress Notes (Signed)
  Subjective:    Patient ID: Nathaniel Stone, male    DOB: August 20, 1928, 78 y.o.   MRN: [id number]  HPI Pt presents to clinic for evaluation of dementia. Accompanied by 3 family members/care takers. They note increasing agitation bordering on physicality. Has not harmed another or self. Did wander from house one day but was recovered without incident. Has 24 hour care at home per family. Continues with insomnia. Total time of visit ~20 minutes of which >50% spent in counseling.  Past Medical History  Diagnosis Date  . GERD (gastroesophageal reflux disease)   . Hypertension   . History of GI bleed     multiple distal duodenal and general diverticula  . Dementia   . Glaucoma   . CAD (coronary artery disease)   . Hyperlipidemia   . Alzheimer disease    Past Surgical History  Procedure Date  . Cholecystectomy   . Back surgery     reports that he has quit smoking. He has never used smokeless tobacco. He reports that he does not drink alcohol or use illicit drugs. family history includes Cancer in his son. No Known Allergies   Review of Systems see hpi    Objective:   Physical Exam  Nursing note and vitals reviewed. Constitutional: He appears well-developed and well-nourished. No distress.  Skin: He is not diaphoretic.          Assessment & Plan:

## 2011-08-17 NOTE — Assessment & Plan Note (Signed)
Long discussion held with family. They are interested in referral to [facility] behavioral health for a temporary inpatient stay-state that they were told could be monitored closely and placed on medication with supervision. They are also considering long term placement in a care facility and we discussed the process. Proceed with referral as above.

## 2011-08-17 NOTE — Telephone Encounter (Signed)
Call returned to patients son Rod at [phone], he was informed HPOA would be left at front desk for patient pick up.

## 2011-09-29 ENCOUNTER — Other Ambulatory Visit: Payer: Self-pay | Admitting: Adult Health

## 2011-09-29 ENCOUNTER — Ambulatory Visit
Admission: RE | Admit: 2011-09-29 | Discharge: 2011-09-29 | Disposition: A | Discharge status: Home / Self Care | Payer: MEDICARE | Source: Ambulatory Visit | Attending: Adult Health | Admitting: Adult Health

## 2011-09-29 DIAGNOSIS — R918 Other nonspecific abnormal finding of lung field: Secondary | ICD-10-CM

## 2011-10-18 ENCOUNTER — Telehealth: Payer: Self-pay | Admitting: *Deleted

## 2011-10-18 NOTE — Telephone Encounter (Signed)
Call returned to Nathaniel Stone at [phone], no answer. A detailed voice message was left informing Nathaniel Stone to drop the requested paperwork off at the front desk; once the paperwork was completed he would be informed. He was advised to call back if any additional questions.

## 2011-10-18 NOTE — Telephone Encounter (Signed)
Patients son Rod called and left voice message wanting to know if Dr Rodena Medin could complete disability forms for patient. His message stated they have moved the patient to an Alzheimer's facility in [place], and in order for medicare to cover the cost; a letter of statement is needed regarding patients current mental status. He would like to know if Dr Rodena Medin would be willing to complete the disability form.

## 2011-10-18 NOTE — Telephone Encounter (Signed)
For him yes.

## 2012-09-28 ENCOUNTER — Ambulatory Visit: Payer: Medicare Other | Admitting: Family

## 2012-10-03 ENCOUNTER — Ambulatory Visit: Payer: Medicare Other | Admitting: Family

## 2012-10-03 DIAGNOSIS — Z0289 Encounter for other administrative examinations: Secondary | ICD-10-CM

## 2013-02-27 ENCOUNTER — Emergency Department (HOSPITAL_COMMUNITY): Payer: Medicare Other

## 2013-02-27 ENCOUNTER — Emergency Department (HOSPITAL_COMMUNITY)
Admission: EM | Admit: 2013-02-27 | Discharge: 2013-02-27 | Disposition: A | Discharge status: Home / Self Care | Payer: Medicare Other | Attending: Emergency Medicine | Admitting: Emergency Medicine

## 2013-02-27 DIAGNOSIS — Y939 Activity, unspecified: Secondary | ICD-10-CM | POA: Insufficient documentation

## 2013-02-27 DIAGNOSIS — Y9289 Other specified places as the place of occurrence of the external cause: Secondary | ICD-10-CM | POA: Insufficient documentation

## 2013-02-27 DIAGNOSIS — I2510 Atherosclerotic heart disease of native coronary artery without angina pectoris: Secondary | ICD-10-CM | POA: Insufficient documentation

## 2013-02-27 DIAGNOSIS — S0003XA Contusion of scalp, initial encounter: Secondary | ICD-10-CM | POA: Insufficient documentation

## 2013-02-27 DIAGNOSIS — Z8639 Personal history of other endocrine, nutritional and metabolic disease: Secondary | ICD-10-CM | POA: Insufficient documentation

## 2013-02-27 DIAGNOSIS — Z79899 Other long term (current) drug therapy: Secondary | ICD-10-CM | POA: Insufficient documentation

## 2013-02-27 DIAGNOSIS — Z87891 Personal history of nicotine dependence: Secondary | ICD-10-CM | POA: Insufficient documentation

## 2013-02-27 DIAGNOSIS — Z8719 Personal history of other diseases of the digestive system: Secondary | ICD-10-CM | POA: Insufficient documentation

## 2013-02-27 DIAGNOSIS — Z862 Personal history of diseases of the blood and blood-forming organs and certain disorders involving the immune mechanism: Secondary | ICD-10-CM | POA: Insufficient documentation

## 2013-02-27 DIAGNOSIS — F0280 Dementia in other diseases classified elsewhere without behavioral disturbance: Secondary | ICD-10-CM | POA: Insufficient documentation

## 2013-02-27 DIAGNOSIS — I10 Essential (primary) hypertension: Secondary | ICD-10-CM | POA: Insufficient documentation

## 2013-02-27 DIAGNOSIS — H409 Unspecified glaucoma: Secondary | ICD-10-CM | POA: Insufficient documentation

## 2013-02-27 DIAGNOSIS — W1809XA Striking against other object with subsequent fall, initial encounter: Secondary | ICD-10-CM | POA: Insufficient documentation

## 2013-02-27 DIAGNOSIS — W19XXXA Unspecified fall, initial encounter: Secondary | ICD-10-CM

## 2013-02-27 DIAGNOSIS — G309 Alzheimer's disease, unspecified: Secondary | ICD-10-CM | POA: Insufficient documentation

## 2013-02-27 LAB — COMPREHENSIVE METABOLIC PANEL
ALT: 6 U/L (ref 0–53)
BUN: 16 mg/dL (ref 6–23)
CO2: 30 mEq/L (ref 19–32)
Calcium: 9.8 mg/dL (ref 8.4–10.5)
Creatinine, Ser: 0.94 mg/dL (ref 0.50–1.35)
GFR calc Af Amer: 88 mL/min — ABNORMAL LOW (ref >90)
GFR calc non Af Amer: 76 mL/min — ABNORMAL LOW (ref >90)
Glucose, Bld: 79 mg/dL (ref 70–99)

## 2013-02-27 LAB — CBC WITH DIFFERENTIAL/PLATELET
Eosinophils Relative: 1 % (ref 0–5)
HCT: 42.8 % (ref 39.0–52.0)
Lymphocytes Relative: 25 % (ref 12–46)
Lymphs Abs: 1.5 10*3/uL (ref 0.7–4.0)
MCH: 30.5 pg (ref 26.0–34.0)
MCV: 90.1 fL (ref 78.0–100.0)
Monocytes Absolute: 0.5 10*3/uL (ref 0.1–1.0)
RBC: 4.75 MIL/uL (ref 4.22–5.81)
WBC: 5.8 10*3/uL (ref 4.0–10.5)

## 2013-02-27 LAB — URINALYSIS, ROUTINE W REFLEX MICROSCOPIC
Nitrite: NEGATIVE
Specific Gravity, Urine: 1.03 (ref 1.005–1.030)
Urobilinogen, UA: 2 mg/dL — ABNORMAL HIGH (ref 0.0–1.0)

## 2013-02-27 NOTE — ED Provider Notes (Signed)
CSN: [id number]     Arrival date & time 02/27/13  1432 History   First MD Initiated Contact with Patient 02/27/13 1500     Chief Complaint  Patient presents with  . Fall  . hematoma to head    Level V caveat for dementia  (Consider location/radiation/quality/duration/timing/severity/associated sxs/prior Treatment) HPI Patient presents via EMS from [facility]. Patient has a history of dementia. Family states they were called and told he fell. Per EMS patient had fallen and when he fell he grabbed onto a TV and fell hitting the back of his head and the TV landing on his chest. They describe there was no loss of consciousness. Family states they're not always sure if he recognizes them. They also are not sure if he can verbalize it is having pain.  PCP Dr Tresa Endo  Past Medical History  Diagnosis Date  . GERD (gastroesophageal reflux disease)   . Hypertension   . History of GI bleed     multiple distal duodenal and general diverticula  . Dementia   . Glaucoma   . CAD (coronary artery disease)   . Hyperlipidemia   . Alzheimer disease    Past Surgical History  Procedure Laterality Date  . Cholecystectomy    . Back surgery     Family History  Problem Relation Age of Onset  . Cancer Son     kidney   History  Substance Use Topics  . Smoking status: Former Games developer  . Smokeless tobacco: Never Used  . Alcohol Use: No  lives in NH about 1 year  Review of Systems  Unable to perform ROS: Dementia    Allergies  Review of patient's allergies indicates no known allergies.  Home Medications   Current Outpatient Rx  Name  Route  Sig  Dispense  Refill  . beta carotene w/minerals (OCUVITE) tablet   Oral   Take 1 tablet by mouth daily.         . divalproex (DEPAKOTE SPRINKLE) 125 MG capsule   Oral   Take 375 mg by mouth 3 (three) times daily.         . fluticasone (FLONASE) 50 MCG/ACT nasal spray   Nasal   Place 2 sprays into the nose daily.          Marland Kitchen latanoprost (XALATAN) 0.005 % ophthalmic solution   Both Eyes   Place 1 drop into both eyes at bedtime.         Marland Kitchen QUEtiapine (SEROQUEL) 50 MG tablet   Oral   Take 50 mg by mouth 2 (two) times daily.         . QUEtiapine (SEROQUEL) 50 MG tablet   Oral   Take 50 mg by mouth every 6 (six) hours as needed (for agitation).         . vitamin B-12 (CYANOCOBALAMIN) 500 MCG tablet   Oral   Take 500 mcg by mouth daily.         . Vitamin D, Ergocalciferol, (DRISDOL) 50000 UNITS CAPS capsule   Oral   Take 50,000 Units by mouth every 7 (seven) days. Mondays.          BP 127/64  Pulse 78  Temp(Src) 98.5 F (36.9 C) (Oral)  Resp 16  SpO2 100%  Vital signs normal   Physical Exam  Nursing note and vitals reviewed. Constitutional:  Non-toxic appearance. He does not appear ill. No distress.  Thin frail elderly male who is uncooperative for physical exam  HENT:  Head: Normocephalic and atraumatic.  Right Ear: External ear normal.  Left Ear: External ear normal.  Nose: Nose normal. No mucosal edema or rhinorrhea.  Mouth/Throat: Mucous membranes are normal. No dental abscesses or edematous.  Refuses to open his mouth, he was asleep when I palpated the back of his head he woke up. There is some firmness that is raised in the back of his head, is not clear whether it is bony or of contusion. There is no abrasions seen  Eyes:  Refuses to open his eyes  Neck: Normal range of motion and full passive range of motion without pain. Neck supple.  Cardiovascular: Normal rate, regular rhythm and normal heart sounds.  Exam reveals no gallop and no friction rub.   No murmur heard. Pulmonary/Chest: Effort normal and breath sounds normal. No respiratory distress. He has no wheezes. He has no rhonchi. He has no rales. He exhibits no tenderness and no crepitus.  There is no obvious bruising, abrasions, or swelling seen to his chest both anterior and posterior  Abdominal: Soft. Normal  appearance and bowel sounds are normal. He exhibits no distension. There is no tenderness. There is no rebound and no guarding.  Musculoskeletal: Normal range of motion. He exhibits no edema and no tenderness.  Moves all extremities well.   Neurological: He has normal strength and normal reflexes.  Patient is crawled up on his side and just wants to sleep  Skin: Skin is warm, dry and intact. No rash noted. No erythema. No pallor.  Psychiatric: His mood appears not anxious.  Patient does not speak except to say that he wants me to get out of his house    ED Course  Procedures (including critical care time)  I discussed with family that he has no obvious injury and at his age if he did have a intracranial injury he would not be a surgical candidate. They're given the option of doing testing while in the ED or sending him back to his facility and having him brought back if there was a change in his behavior. They have elected to have him stay and get CT of his head, x-rays and they would like blood work to be done also.  Family given results of tests.    Labs Review  Results for orders placed during the hospital encounter of 02/27/13  CBC WITH DIFFERENTIAL      Result Value Range   WBC 5.8  4.0 - 10.5 K/uL   RBC 4.75  4.22 - 5.81 MIL/uL   Hemoglobin 14.5  13.0 - 17.0 g/dL   HCT 16.1  09.6 - 04.5 %   MCV 90.1  78.0 - 100.0 fL   MCH 30.5  26.0 - 34.0 pg   MCHC 33.9  30.0 - 36.0 g/dL   RDW 40.9  81.1 - 91.4 %   Platelets 176  150 - 400 K/uL   Neutrophils Relative % 66  43 - 77 %   Neutro Abs 3.8  1.7 - 7.7 K/uL   Lymphocytes Relative 25  12 - 46 %   Lymphs Abs 1.5  0.7 - 4.0 K/uL   Monocytes Relative 8  3 - 12 %   Monocytes Absolute 0.5  0.1 - 1.0 K/uL   Eosinophils Relative 1  0 - 5 %   Eosinophils Absolute 0.1  0.0 - 0.7 K/uL   Basophils Relative 0  0 - 1 %   Basophils Absolute 0.0  0.0 - 0.1 K/uL  COMPREHENSIVE METABOLIC PANEL  Result Value Range   Sodium 144  135 - 145  mEq/L   Potassium 4.3  3.5 - 5.1 mEq/L   Chloride 106  96 - 112 mEq/L   CO2 30  19 - 32 mEq/L   Glucose, Bld 79  70 - 99 mg/dL   BUN 16  6 - 23 mg/dL   Creatinine, Ser 1.61  0.50 - 1.35 mg/dL   Calcium 9.8  8.4 - 09.6 mg/dL   Total Protein 6.7  6.0 - 8.3 g/dL   Albumin 3.1 (*) 3.5 - 5.2 g/dL   AST 15  0 - 37 U/L   ALT 6  0 - 53 U/L   Alkaline Phosphatase 67  39 - 117 U/L   Total Bilirubin 0.3  0.3 - 1.2 mg/dL   GFR calc non Af Amer 76 (*) >90 mL/min   GFR calc Af Amer 88 (*) >90 mL/min  URINALYSIS, ROUTINE W REFLEX MICROSCOPIC      Result Value Range   Color, Urine AMBER (*) YELLOW   APPearance CLEAR  CLEAR   Specific Gravity, Urine 1.030  1.005 - 1.030   pH 6.0  5.0 - 8.0   Glucose, UA NEGATIVE  NEGATIVE mg/dL   Hgb urine dipstick NEGATIVE  NEGATIVE   Bilirubin Urine SMALL (*) NEGATIVE   Ketones, ur NEGATIVE  NEGATIVE mg/dL   Protein, ur NEGATIVE  NEGATIVE mg/dL   Urobilinogen, UA 2.0 (*) 0.0 - 1.0 mg/dL   Nitrite NEGATIVE  NEGATIVE   Leukocytes, UA NEGATIVE  NEGATIVE  VALPROIC ACID LEVEL      Result Value Range   Valproic Acid Lvl 51.1  50.0 - 100.0 ug/mL   Laboratory interpretation all normal    Imaging Review Dg Chest 2 View  02/27/2013   *RADIOLOGY REPORT*  Clinical Data: Fall  CHEST - 2 VIEW  Comparison: 09/29/2011  Findings: Low lung volumes.  No focal consolidation.  No pleural effusion or pneumothorax.  The heart is normal in size.  IMPRESSION: No evidence of acute cardiopulmonary disease.   Original Report Authenticated By: Charline Bills, M.D.   Ct Head Wo Contrast  02/27/2013   *RADIOLOGY REPORT*  Clinical Data: Fall, scalp hematoma  CT HEAD WITHOUT CONTRAST  Technique:  Contiguous axial images were obtained from the base of the skull through the vertex without contrast.  Comparison: Head CT T 42,013  Findings: Patient head motion degrades the skull base images.  No intracranial hemorrhage.  No parenchymal contusion.  No midline shift or mass effect.  Basilar  cisterns are patent. No skull base fracture.  No fluid in the paranasal sinuses or mastoid air cells.  There is generalized cortical atrophy and periventricular white matter hypodensities unchanged.  Hypodensity in the left insular ribbon appears slightly increased.  This it could be white matter disease or cortical disease (image 12).  IMPRESSION:  1.  No intracranial trauma. 2.  Atrophy and microvascular disease again demonstrated. 3.  Low density within the left insular ribbon and or external capsule appears increased.  Cannot exclude an  insular infarction but favor deep white matter disease.   Original Report Authenticated By: Genevive Bi, M.D.    MDM   1. Fall at nursing home, initial encounter      Plan discharge  Devoria Albe, MD, Franz Dell, MD 02/27/13 [phone]

## 2013-02-27 NOTE — Progress Notes (Signed)
CSW met with pt at bedside.  Pt was sleeping.  His sons were at his bedside.  They stated that they shared legal guardianship of their father and all of his affairs.  They were able to confirm that pt is a current resident of [facility], and that once medically stable he will return to facility.   Family thanked CSW for support and concern.   Efrain Clauson, son, [phone] Josias Tomerlin, son, [phone]  .Marva Panda, LCSWA  [phone]  .02/27/2013 4:40 pm

## 2013-02-27 NOTE — ED Notes (Signed)
[facility] called for transport back to [facility].  

## 2013-02-27 NOTE — ED Notes (Signed)
Pt from [facility]  Per report: Pt lost balance in an activity room at the SNF and was falling while holding onto a 40" flatscreen TV which fell onto his chest.  He also has a hematoma to back of head but NO LOC.(not on blood thinners).  Hx of alzheimers and is a flight risk.  Denied neck/back pain to EMS who didn't see any deformities.  VSS 128/70, 80, 16.

## 2013-06-04 ENCOUNTER — Ambulatory Visit: Payer: Medicare Other | Admitting: Family

## 2013-06-08 ENCOUNTER — Ambulatory Visit: Payer: Medicare Other | Admitting: Family

## 2013-06-15 ENCOUNTER — Encounter: Payer: Self-pay | Admitting: Family

## 2013-06-15 ENCOUNTER — Ambulatory Visit (INDEPENDENT_AMBULATORY_CARE_PROVIDER_SITE_OTHER): Payer: Medicare Other | Admitting: Family

## 2013-06-15 ENCOUNTER — Telehealth: Payer: Self-pay | Admitting: Family

## 2013-06-15 VITALS — BP 100/70 | HR 110 | Resp 16

## 2013-06-15 DIAGNOSIS — F0390 Unspecified dementia without behavioral disturbance: Secondary | ICD-10-CM

## 2013-06-15 DIAGNOSIS — I10 Essential (primary) hypertension: Secondary | ICD-10-CM

## 2013-06-15 DIAGNOSIS — Z5181 Encounter for therapeutic drug level monitoring: Secondary | ICD-10-CM

## 2013-06-15 LAB — BASIC METABOLIC PANEL
Calcium: 9.1 mg/dL (ref 8.4–10.5)
Glucose, Bld: 62 mg/dL — ABNORMAL LOW (ref 70–99)
Sodium: 145 mEq/L (ref 135–145)

## 2013-06-15 NOTE — Assessment & Plan Note (Signed)
79 yr old male with advanced dementia.  Spoke at length with son's today.  Also spoke with Nathaniel Stone re: their concerns.  See phone note.  FL2 form filled today.

## 2013-06-15 NOTE — Patient Instructions (Signed)
Please follow up in 3 months, sooner if problems/concerns.  
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
## 2013-06-15 NOTE — Progress Notes (Addendum)
Subjective:    Patient ID: Nathaniel Stone, male    DOB: 01-12-1929, 79 y.o.   MRN: [id number]  HPI  Mr. Widrig is an 79 yr old male who is brought here today by his two sons. He suffers from advanced dementia and currently lives in an assisted living facility. His son's are concerned that he is requiring complete care and that the ALF is not sufficient to meet his current needs.  They also express concern that the patient remain's sedated and is "overmedicated."   They are not satified with the care he is receiving. [facility] (formerly [place] [facility] The sons are concerned that he is becoming increasingly malnourished because he is not being given the proper diet at the facility (he is edentulous) and he cannot feed himself.  Per son's the assisted living facility dose not feed pts.The patient is incontinent of stool and urine.  He requires assistance with feeding.  Very little mobility.  Requires assist and prompting with a walker. Has visual impairment.  Making self care even more difficult. He is followed by a provider at the facility.    The sons are working on getting medicaid coverage for the patient. They are interested in placement in a skilled nursing facility.  He has been on aricept and namenda in the past but the family tells me that this was discontinued due to cost and the fact that it was not providing much improvement in his dementia symptoms.    Review of Systems See HPI  Past Medical History  Diagnosis Date  . GERD (gastroesophageal reflux disease)   . Hypertension   . History of GI bleed     multiple distal duodenal and general diverticula  . Dementia   . Glaucoma   . CAD (coronary artery disease)   . Hyperlipidemia   . Alzheimer disease     History   Social History  . Marital Status: Married    Spouse Name: N/A    Number of Children: 4  . Years of Education: N/A   Occupational History  . Retired Corporate investment banker    Social History Main Topics  . Smoking  status: Former Games developer  . Smokeless tobacco: Never Used  . Alcohol Use: No  . Drug Use: No  . Sexual Activity: Not on file   Other Topics Concern  . Not on file   Social History Narrative  . No narrative on file    Past Surgical History  Procedure Laterality Date  . Cholecystectomy    . Back surgery      Family History  Problem Relation Age of Onset  . Cancer Son     kidney    No Known Allergies  Current Outpatient Prescriptions on File Prior to Visit  Medication Sig Dispense Refill  . beta carotene w/minerals (OCUVITE) tablet Take 1 tablet by mouth daily.      . divalproex (DEPAKOTE SPRINKLE) 125 MG capsule Take 375 mg by mouth 3 (three) times daily.      . fluticasone (FLONASE) 50 MCG/ACT nasal spray Place 2 sprays into the nose daily.      Marland Kitchen latanoprost (XALATAN) 0.005 % ophthalmic solution Place 1 drop into both eyes at bedtime.      Marland Kitchen QUEtiapine (SEROQUEL) 50 MG tablet Take 50 mg by mouth 2 (two) times daily.      . QUEtiapine (SEROQUEL) 50 MG tablet Take 50 mg by mouth every 6 (six) hours as needed (for agitation).      Marland Kitchen  vitamin B-12 (CYANOCOBALAMIN) 500 MCG tablet Take 500 mcg by mouth daily.      . Vitamin D, Ergocalciferol, (DRISDOL) 50000 UNITS CAPS capsule Take 50,000 Units by mouth every 7 (seven) days. Mondays.       No current facility-administered medications on file prior to visit.    BP 100/70  Pulse 110  Resp 16  SpO2 99%       Objective:   Physical Exam  Constitutional:  Frail, elderly AA male, seated in wheel chair, singing. Pleasant but confused.   HENT:  Head: Normocephalic and atraumatic.  Cardiovascular: Normal rate and regular rhythm.   No murmur heard. Pulmonary/Chest: Effort normal and breath sounds normal. No respiratory distress. He has no wheezes. He has no rales. He exhibits no tenderness.  Musculoskeletal: He exhibits no edema.  Lymphadenopathy:    He has no cervical adenopathy.  Neurological: He is alert.  Recognizes sons  only.   Skin: Skin is warm.  Psychiatric:  Calm.  confused          Assessment & Plan:  25 minutes spent with patient today.  >50% of this time was spent today with  Patient and family on counseling re: medications, dementiaand SNF placement.

## 2013-06-15 NOTE — Progress Notes (Signed)
Pre visit review using our clinic review tool, if applicable. No additional management support is needed unless otherwise documented below in the visit note. 

## 2013-06-16 LAB — VALPROIC ACID LEVEL: Valproic Acid Lvl: 46.1 ug/mL — ABNORMAL LOW (ref 50.0–100.0)

## 2013-06-17 ENCOUNTER — Encounter: Payer: Self-pay | Admitting: Family

## 2013-07-12 ENCOUNTER — Emergency Department (HOSPITAL_COMMUNITY): Payer: Medicare Other

## 2013-07-12 ENCOUNTER — Encounter (HOSPITAL_COMMUNITY): Payer: Self-pay | Admitting: Emergency Medicine

## 2013-07-12 ENCOUNTER — Emergency Department (HOSPITAL_COMMUNITY)
Admission: EM | Admit: 2013-07-12 | Discharge: 2013-07-12 | Disposition: A | Discharge status: Home / Self Care | Payer: Medicare Other | Attending: Emergency Medicine | Admitting: Emergency Medicine

## 2013-07-12 DIAGNOSIS — W19XXXA Unspecified fall, initial encounter: Secondary | ICD-10-CM

## 2013-07-12 DIAGNOSIS — K219 Gastro-esophageal reflux disease without esophagitis: Secondary | ICD-10-CM | POA: Insufficient documentation

## 2013-07-12 DIAGNOSIS — I10 Essential (primary) hypertension: Secondary | ICD-10-CM | POA: Insufficient documentation

## 2013-07-12 DIAGNOSIS — G309 Alzheimer's disease, unspecified: Secondary | ICD-10-CM | POA: Insufficient documentation

## 2013-07-12 DIAGNOSIS — Z862 Personal history of diseases of the blood and blood-forming organs and certain disorders involving the immune mechanism: Secondary | ICD-10-CM | POA: Insufficient documentation

## 2013-07-12 DIAGNOSIS — I2510 Atherosclerotic heart disease of native coronary artery without angina pectoris: Secondary | ICD-10-CM | POA: Insufficient documentation

## 2013-07-12 DIAGNOSIS — IMO0002 Reserved for concepts with insufficient information to code with codable children: Secondary | ICD-10-CM | POA: Insufficient documentation

## 2013-07-12 DIAGNOSIS — Y9210 Unspecified residential institution as the place of occurrence of the external cause: Secondary | ICD-10-CM | POA: Insufficient documentation

## 2013-07-12 DIAGNOSIS — Z880 Allergy status to penicillin: Secondary | ICD-10-CM | POA: Insufficient documentation

## 2013-07-12 DIAGNOSIS — Y939 Activity, unspecified: Secondary | ICD-10-CM | POA: Insufficient documentation

## 2013-07-12 DIAGNOSIS — Z043 Encounter for examination and observation following other accident: Secondary | ICD-10-CM | POA: Insufficient documentation

## 2013-07-12 DIAGNOSIS — Z8639 Personal history of other endocrine, nutritional and metabolic disease: Secondary | ICD-10-CM | POA: Insufficient documentation

## 2013-07-12 DIAGNOSIS — H409 Unspecified glaucoma: Secondary | ICD-10-CM | POA: Insufficient documentation

## 2013-07-12 DIAGNOSIS — Z79899 Other long term (current) drug therapy: Secondary | ICD-10-CM | POA: Insufficient documentation

## 2013-07-12 DIAGNOSIS — R296 Repeated falls: Secondary | ICD-10-CM | POA: Insufficient documentation

## 2013-07-12 DIAGNOSIS — Z87891 Personal history of nicotine dependence: Secondary | ICD-10-CM | POA: Insufficient documentation

## 2013-07-12 DIAGNOSIS — F0280 Dementia in other diseases classified elsewhere without behavioral disturbance: Secondary | ICD-10-CM | POA: Insufficient documentation

## 2013-07-12 LAB — CBC WITH DIFFERENTIAL/PLATELET
Basophils Absolute: 0 10*3/uL (ref 0.0–0.1)
Basophils Relative: 0 % (ref 0–1)
EOS ABS: 0.1 10*3/uL (ref 0.0–0.7)
EOS PCT: 1 % (ref 0–5)
HEMATOCRIT: 40.6 % (ref 39.0–52.0)
Hemoglobin: 13.7 g/dL (ref 13.0–17.0)
LYMPHS ABS: 2 10*3/uL (ref 0.7–4.0)
LYMPHS PCT: 25 % (ref 12–46)
MCH: 31.1 pg (ref 26.0–34.0)
MCHC: 33.7 g/dL (ref 30.0–36.0)
MCV: 92.3 fL (ref 78.0–100.0)
MONO ABS: 1.1 10*3/uL — AB (ref 0.1–1.0)
Monocytes Relative: 14 % — ABNORMAL HIGH (ref 3–12)
Neutro Abs: 4.7 10*3/uL (ref 1.7–7.7)
Neutrophils Relative %: 60 % (ref 43–77)
PLATELETS: 143 10*3/uL — AB (ref 150–400)
RBC: 4.4 MIL/uL (ref 4.22–5.81)
RDW: 13.5 % (ref 11.5–15.5)
WBC: 7.9 10*3/uL (ref 4.0–10.5)

## 2013-07-12 LAB — URINALYSIS, ROUTINE W REFLEX MICROSCOPIC
Glucose, UA: NEGATIVE mg/dL
HGB URINE DIPSTICK: NEGATIVE
Ketones, ur: NEGATIVE mg/dL
LEUKOCYTES UA: NEGATIVE
NITRITE: NEGATIVE
PROTEIN: NEGATIVE mg/dL
SPECIFIC GRAVITY, URINE: 1.028 (ref 1.005–1.030)
UROBILINOGEN UA: 4 mg/dL — AB (ref 0.0–1.0)
pH: 7 (ref 5.0–8.0)

## 2013-07-12 LAB — COMPREHENSIVE METABOLIC PANEL
ALT: 8 U/L (ref 0–53)
AST: 18 U/L (ref 0–37)
Albumin: 2.9 g/dL — ABNORMAL LOW (ref 3.5–5.2)
Alkaline Phosphatase: 64 U/L (ref 39–117)
BUN: 20 mg/dL (ref 6–23)
CALCIUM: 9.1 mg/dL (ref 8.4–10.5)
CO2: 29 meq/L (ref 19–32)
CREATININE: 1.01 mg/dL (ref 0.50–1.35)
Chloride: 107 mEq/L (ref 96–112)
GFR, EST AFRICAN AMERICAN: 78 mL/min — AB (ref >90)
GFR, EST NON AFRICAN AMERICAN: 67 mL/min — AB (ref >90)
GLUCOSE: 88 mg/dL (ref 70–99)
Potassium: 4.8 mEq/L (ref 3.7–5.3)
Sodium: 147 mEq/L (ref 137–147)
TOTAL PROTEIN: 6.2 g/dL (ref 6.0–8.3)
Total Bilirubin: 0.3 mg/dL (ref 0.3–1.2)

## 2013-07-12 LAB — TROPONIN I

## 2013-07-12 LAB — CG4 I-STAT (LACTIC ACID): LACTIC ACID, VENOUS: 2.63 mmol/L — AB (ref 0.5–2.2)

## 2013-07-12 MED ORDER — ACETAMINOPHEN 650 MG RE SUPP
650.0000 mg | Freq: Once | RECTAL | Status: AC
  Administered 2013-07-12: 650 mg via RECTAL
  Filled 2013-07-12: qty 1

## 2013-07-12 MED ORDER — SODIUM CHLORIDE 0.9 % IV SOLN: INTRAVENOUS | Status: DC

## 2013-07-12 MED ORDER — SODIUM CHLORIDE 0.9 % IV BOLUS (SEPSIS)
1000.0000 mL | Freq: Once | INTRAVENOUS | Status: AC
  Administered 2013-07-12: 1000 mL via INTRAVENOUS

## 2013-07-12 NOTE — ED Notes (Signed)
MD at bedside. Dr. Freida BusmanAllen at bedside. Pt's family at bedside.

## 2013-07-12 NOTE — Discharge Instructions (Signed)
Followup with your doctor as needed

## 2013-07-12 NOTE — ED Notes (Signed)
Pt BIB EMS. [facility] Pt was found on floor by staff lying on R side. Unwitnessed fall. Pt has hx of dementia. Pt has no obvious injury or deformity. LOC at baseline per EMS.

## 2013-07-12 NOTE — ED Notes (Signed)
[facility].

## 2013-07-12 NOTE — ED Notes (Signed)
Bed: WA17 Expected date:  Expected time:  Means of arrival:  Comments: EMS fall 

## 2013-07-12 NOTE — ED Provider Notes (Addendum)
CSN: [id number]     Arrival date & time 07/12/13  1613 History   First MD Initiated Contact with Patient 07/12/13 1614     Chief Complaint  Patient presents with  . Fall  . Dementia   (Consider location/radiation/quality/duration/timing/severity/associated sxs/prior Treatment) Patient is a 80 y.o. male presenting with fall. The history is provided by a relative. The history is limited by the condition of the patient.  Fall   this is a 80 year old male who presents after having an unwitnessed fall at the nursing home. He has a history of dementia and cannot give any history. According to the son, who is at the facility, the patient and his baseline. There had been no recent illnesses. EMS was called and the patient was found lying on the floor. No obvious injury deformities were noted. No treatment initiated and patient transported here  Past Medical History  Diagnosis Date  . GERD (gastroesophageal reflux disease)   . Hypertension   . History of GI bleed     multiple distal duodenal and general diverticula  . Dementia   . Glaucoma   . CAD (coronary artery disease)   . Hyperlipidemia   . Alzheimer disease    Past Surgical History  Procedure Laterality Date  . Cholecystectomy    . Back surgery     Family History  Problem Relation Age of Onset  . Cancer Son     kidney   History  Substance Use Topics  . Smoking status: Former Games developer  . Smokeless tobacco: Never Used  . Alcohol Use: No    Review of Systems  Unable to perform ROS   Allergies  Geodon and Penicillins  Home Medications   Current Outpatient Rx  Name  Route  Sig  Dispense  Refill  . acetaminophen (TYLENOL) 500 MG tablet   Oral   Take 500 mg by mouth every 6 (six) hours as needed for mild pain or fever.          . beta carotene w/minerals (OCUVITE) tablet   Oral   Take 1 tablet by mouth daily. W/Lutein.         . Calcium Carbonate Antacid (MAALOX PO)   Oral   Take 30 mLs by mouth 4 (four) times  daily as needed (heartburn/indigestion.).          Marland Kitchen cetirizine (ZYRTEC) 10 MG tablet   Oral   Take 10 mg by mouth daily.         . divalproex (DEPAKOTE SPRINKLE) 125 MG capsule   Oral   Take 375 mg by mouth 3 (three) times daily.         . fluticasone (FLONASE) 50 MCG/ACT nasal spray   Nasal   Place 2 sprays into the nose daily.         Marland Kitchen guaiFENesin (ROBITUSSIN) 100 MG/5ML SOLN   Oral   Take 10 mLs by mouth every 6 (six) hours as needed for cough or to loosen phlegm.         . latanoprost (XALATAN) 0.005 % ophthalmic solution   Both Eyes   Place 1 drop into both eyes at bedtime.         Marland Kitchen loperamide (ANTI-DIARRHEAL) 2 MG tablet   Oral   Take 2 mg by mouth 4 (four) times daily as needed for diarrhea or loose stools.         . Magnesium Hydroxide (MILK OF MAGNESIA PO)   Oral   Take 30 mLs by mouth at  bedtime as needed (constipation.).          Marland Kitchen. NON FORMULARY      MIGHTY SHAKE. 1 three times a day.         Marland Kitchen. QUEtiapine (SEROQUEL) 50 MG tablet   Oral   Take 50 mg by mouth 2 (two) times daily.         . QUEtiapine (SEROQUEL) 50 MG tablet   Oral   Take 50 mg by mouth every 6 (six) hours as needed (for agitation).         . vitamin B-12 (CYANOCOBALAMIN) 500 MCG tablet   Oral   Take 500 mcg by mouth daily.         . Vitamin D, Ergocalciferol, (DRISDOL) 50000 UNITS CAPS capsule   Oral   Take 50,000 Units by mouth every 7 (seven) days. Mondays.          BP 97/49  Pulse 70  Temp(Src) 100.1 F (37.8 C) (Oral)  Resp 17  SpO2 100% Physical Exam  Nursing note and vitals reviewed. Constitutional: He appears well-developed and well-nourished.  Non-toxic appearance. No distress.  HENT:  Head: Normocephalic and atraumatic.  Eyes: Conjunctivae, EOM and lids are normal. Pupils are equal, round, and reactive to light.  Neck: Normal range of motion. Neck supple. No tracheal deviation present. No mass present.  Cardiovascular: Normal rate, regular  rhythm and normal heart sounds.  Exam reveals no gallop.   No murmur heard. Pulmonary/Chest: Effort normal and breath sounds normal. No stridor. No respiratory distress. He has no decreased breath sounds. He has no wheezes. He has no rhonchi. He has no rales.  Abdominal: Soft. Normal appearance and bowel sounds are normal. He exhibits no distension. There is no tenderness. There is no rebound and no CVA tenderness.  Musculoskeletal: Normal range of motion. He exhibits no edema and no tenderness.  Neurological: He is alert. No cranial nerve deficit or sensory deficit. GCS eye subscore is 4. GCS verbal subscore is 4. GCS motor subscore is 5.  Skin: Skin is warm and dry. No abrasion and no rash noted.  Psychiatric: His affect is blunt. His speech is delayed. He is withdrawn.    ED Course  Procedures (including critical care time) Labs Review Labs Reviewed - No data to display Imaging Review No results found.  EKG Interpretation    Date/Time:  Thursday July 12 2013 17:10:06 EST Ventricular Rate:  84 PR Interval:  146 QRS Duration: 82 QT Interval:  364 QTC Calculation: 430 R Axis:   49 Text Interpretation:  Sinus rhythm Ventricular premature complex Borderline repolarization abnormality Minimal ST elevation, anterior leads Baseline wander in lead(s) V3 V5 Confirmed by Laquana Villari  MD, Shamara Soza ([id number]) on 07/12/2013 5:55:16 PM            MDM  No diagnosis found. Pt given tylenol for low-grade temp--work up without evidence of infection ( nl ua/cxr, and wbc), lactate level noted Pt monitored and per family remains at baseline No evidence of head trauma and therefore would not pursue head CT--family is comfortable with this  Stable for d/c    Toy BakerAnthony T Shakeila Pfarr, MD 07/12/13 1906  Toy BakerAnthony T Isidora Laham, MD 07/12/13 [phone]

## 2013-07-13 LAB — URINE CULTURE
Colony Count: NO GROWTH
Culture: NO GROWTH

## 2013-07-19 ENCOUNTER — Telehealth: Payer: Self-pay | Admitting: *Deleted

## 2013-07-19 MED ORDER — CETIRIZINE HCL 10 MG PO TABS: 10.0000 mg | ORAL_TABLET | Freq: Every day | ORAL | Status: DC

## 2013-07-19 NOTE — Telephone Encounter (Signed)
Rx sent for zyrtec.  Will try to have fl2 completed by Monday- will contact them when complete.

## 2013-07-19 NOTE — Telephone Encounter (Signed)
Patient's son called and stated that the assisting living doctor will not refill his zytrec.Son would like to know if he could get a RX for that. Son also states  that a FL2 form needs to be filled and would like to know if he could come by and pick that up on Monday. Please advise.

## 2013-07-25 ENCOUNTER — Telehealth: Payer: Self-pay | Admitting: Family

## 2013-07-27 MED ORDER — CETIRIZINE HCL 10 MG PO TABS: 10.0000 mg | ORAL_TABLET | Freq: Every day | ORAL | Status: DC

## 2013-07-27 NOTE — Addendum Note (Signed)
Addended by: Mervin KungFERGERSON, Jenny Omdahl A on: 07/27/2013 03:27 PM   Modules accepted: Orders

## 2013-07-27 NOTE — Telephone Encounter (Signed)
FL2 form printed from December and date updated. Notified pt's son. He will pick up FL2 on Monday, form placed at front desk for pick up. He also [facility] [address]  [facility]

## 2013-08-01 NOTE — Telephone Encounter (Signed)
Opened in error

## 2013-08-24 ENCOUNTER — Emergency Department (HOSPITAL_COMMUNITY): Payer: Medicare Other

## 2013-08-24 ENCOUNTER — Emergency Department (HOSPITAL_COMMUNITY)
Admission: EM | Admit: 2013-08-24 | Discharge: 2013-08-24 | Disposition: A | Discharge status: Home / Self Care | Payer: Medicare Other | Attending: Emergency Medicine | Admitting: Emergency Medicine

## 2013-08-24 ENCOUNTER — Encounter (HOSPITAL_COMMUNITY): Payer: Self-pay | Admitting: Emergency Medicine

## 2013-08-24 DIAGNOSIS — K219 Gastro-esophageal reflux disease without esophagitis: Secondary | ICD-10-CM | POA: Insufficient documentation

## 2013-08-24 DIAGNOSIS — IMO0002 Reserved for concepts with insufficient information to code with codable children: Secondary | ICD-10-CM | POA: Insufficient documentation

## 2013-08-24 DIAGNOSIS — Z862 Personal history of diseases of the blood and blood-forming organs and certain disorders involving the immune mechanism: Secondary | ICD-10-CM | POA: Insufficient documentation

## 2013-08-24 DIAGNOSIS — Z87891 Personal history of nicotine dependence: Secondary | ICD-10-CM | POA: Insufficient documentation

## 2013-08-24 DIAGNOSIS — I2510 Atherosclerotic heart disease of native coronary artery without angina pectoris: Secondary | ICD-10-CM | POA: Insufficient documentation

## 2013-08-24 DIAGNOSIS — Z880 Allergy status to penicillin: Secondary | ICD-10-CM | POA: Insufficient documentation

## 2013-08-24 DIAGNOSIS — Z79899 Other long term (current) drug therapy: Secondary | ICD-10-CM | POA: Insufficient documentation

## 2013-08-24 DIAGNOSIS — F0390 Unspecified dementia without behavioral disturbance: Secondary | ICD-10-CM

## 2013-08-24 DIAGNOSIS — F0280 Dementia in other diseases classified elsewhere without behavioral disturbance: Secondary | ICD-10-CM | POA: Insufficient documentation

## 2013-08-24 DIAGNOSIS — Z8639 Personal history of other endocrine, nutritional and metabolic disease: Secondary | ICD-10-CM | POA: Insufficient documentation

## 2013-08-24 DIAGNOSIS — G309 Alzheimer's disease, unspecified: Principal | ICD-10-CM | POA: Insufficient documentation

## 2013-08-24 DIAGNOSIS — I10 Essential (primary) hypertension: Secondary | ICD-10-CM | POA: Insufficient documentation

## 2013-08-24 DIAGNOSIS — H409 Unspecified glaucoma: Secondary | ICD-10-CM | POA: Insufficient documentation

## 2013-08-24 LAB — CBC WITH DIFFERENTIAL/PLATELET
BASOS ABS: 0 10*3/uL (ref 0.0–0.1)
Basophils Relative: 0 % (ref 0–1)
Eosinophils Absolute: 0 10*3/uL (ref 0.0–0.7)
Eosinophils Relative: 0 % (ref 0–5)
HCT: 42.9 % (ref 39.0–52.0)
Hemoglobin: 14.5 g/dL (ref 13.0–17.0)
LYMPHS ABS: 1.2 10*3/uL (ref 0.7–4.0)
LYMPHS PCT: 9 % — AB (ref 12–46)
MCH: 30.9 pg (ref 26.0–34.0)
MCHC: 33.8 g/dL (ref 30.0–36.0)
MCV: 91.3 fL (ref 78.0–100.0)
Monocytes Absolute: 1.9 10*3/uL — ABNORMAL HIGH (ref 0.1–1.0)
Monocytes Relative: 14 % — ABNORMAL HIGH (ref 3–12)
NEUTROS PCT: 77 % (ref 43–77)
Neutro Abs: 10 10*3/uL — ABNORMAL HIGH (ref 1.7–7.7)
PLATELETS: 240 10*3/uL (ref 150–400)
RBC: 4.7 MIL/uL (ref 4.22–5.81)
RDW: 13.5 % (ref 11.5–15.5)
WBC: 13 10*3/uL — AB (ref 4.0–10.5)

## 2013-08-24 LAB — BASIC METABOLIC PANEL
BUN: 31 mg/dL — ABNORMAL HIGH (ref 6–23)
CALCIUM: 9.7 mg/dL (ref 8.4–10.5)
CO2: 24 mEq/L (ref 19–32)
CREATININE: 1.02 mg/dL (ref 0.50–1.35)
Chloride: 101 mEq/L (ref 96–112)
GFR calc Af Amer: 77 mL/min — ABNORMAL LOW (ref >90)
GFR, EST NON AFRICAN AMERICAN: 66 mL/min — AB (ref >90)
Glucose, Bld: 114 mg/dL — ABNORMAL HIGH (ref 70–99)
Potassium: 4.5 mEq/L (ref 3.7–5.3)
Sodium: 140 mEq/L (ref 137–147)

## 2013-08-24 LAB — TYPE AND SCREEN
ABO/RH(D): O POS
ANTIBODY SCREEN: NEGATIVE

## 2013-08-24 LAB — PROTIME-INR
INR: 1.11 (ref 0.00–1.49)
Prothrombin Time: 14.1 seconds (ref 11.6–15.2)

## 2013-08-24 MED ORDER — SODIUM CHLORIDE 0.9 % IV SOLN
1000.0000 mL | INTRAVENOUS | Status: DC
Start: 2013-08-24 — End: 2013-08-25
  Administered 2013-08-24: 1000 mL via INTRAVENOUS

## 2013-08-24 MED ORDER — MORPHINE SULFATE 4 MG/ML IJ SOLN
4.0000 mg | INTRAMUSCULAR | Status: DC | PRN
Start: 2013-08-24 — End: 2013-08-25
  Administered 2013-08-24: 4 mg via INTRAVENOUS
  Filled 2013-08-24: qty 1

## 2013-08-24 NOTE — ED Provider Notes (Signed)
CSN: [id number]     Arrival date & time 08/24/13  1812 History   First MD Initiated Contact with Patient 08/24/13 1822     Chief Complaint  Patient presents with  . Hip Pain   Level V caveat: Dementia HPI Patient presents to the emergency room for evaluation of potential head injury. Patient has history of severe dementia.  Patient is unable to provide me with any history. According to the facility the staff noted the patient appears to have pain and questionable deformity of his left hip. They deny any known fall or injury. Patient apparently was grimacing when they palpated his left hip. Past Medical History  Diagnosis Date  . GERD (gastroesophageal reflux disease)   . Hypertension   . History of GI bleed     multiple distal duodenal and general diverticula  . Dementia   . Glaucoma   . CAD (coronary artery disease)   . Hyperlipidemia   . Alzheimer disease    Past Surgical History  Procedure Laterality Date  . Cholecystectomy    . Back surgery     Family History  Problem Relation Age of Onset  . Cancer Son     kidney   History  Substance Use Topics  . Smoking status: Former Games developermoker  . Smokeless tobacco: Never Used  . Alcohol Use: No    Review of Systems  Unable to perform ROS: Dementia      Allergies  Geodon and Penicillins  Home Medications   Current Outpatient Rx  Name  Route  Sig  Dispense  Refill  . acetaminophen (TYLENOL) 500 MG tablet   Oral   Take 500 mg by mouth every 6 (six) hours as needed for mild pain or fever.          . beta carotene w/minerals (OCUVITE) tablet   Oral   Take 1 tablet by mouth daily. W/Lutein.         . Calcium Carbonate Antacid (MAALOX PO)   Oral   Take 30 mLs by mouth 4 (four) times daily as needed (heartburn/indigestion.).          Marland Kitchen. divalproex (DEPAKOTE SPRINKLE) 125 MG capsule   Oral   Take 375 mg by mouth 3 (three) times daily.         . fluticasone (FLONASE) 50 MCG/ACT nasal spray   Nasal   Place 2  sprays into the nose daily.         Marland Kitchen. guaiFENesin (ROBITUSSIN) 100 MG/5ML SOLN   Oral   Take 10 mLs by mouth every 6 (six) hours as needed for cough or to loosen phlegm.         . latanoprost (XALATAN) 0.005 % ophthalmic solution   Both Eyes   Place 1 drop into both eyes at bedtime.         Marland Kitchen. loperamide (ANTI-DIARRHEAL) 2 MG tablet   Oral   Take 2 mg by mouth 4 (four) times daily as needed for diarrhea or loose stools.         . Magnesium Hydroxide (MILK OF MAGNESIA PO)   Oral   Take 30 mLs by mouth at bedtime as needed (constipation.).          Marland Kitchen. QUEtiapine (SEROQUEL) 50 MG tablet   Oral   Take 50 mg by mouth 2 (two) times daily.         . QUEtiapine (SEROQUEL) 50 MG tablet   Oral   Take 50 mg by mouth every 6 (six)  hours as needed (for agitation).         . vitamin B-12 (CYANOCOBALAMIN) 500 MCG tablet   Oral   Take 500 mcg by mouth daily.         . Vitamin D, Ergocalciferol, (DRISDOL) 50000 UNITS CAPS capsule   Oral   Take 50,000 Units by mouth every 7 (seven) days. Mondays.          BP 108/67  Pulse 102  Resp 14  SpO2 100% Physical Exam  Nursing note and vitals reviewed. Constitutional: No distress.  Elderly, frail  HENT:  Head: Normocephalic and atraumatic.  Right Ear: External ear normal.  Left Ear: External ear normal.  Eyes: Conjunctivae are normal. Right eye exhibits no discharge. Left eye exhibits no discharge. No scleral icterus.  Neck: Neck supple. No tracheal deviation present.  Cardiovascular: Normal rate, regular rhythm and intact distal pulses.   Pulmonary/Chest: Effort normal and breath sounds normal. No stridor. No respiratory distress. He has no wheezes. He has no rales.  Abdominal: Soft. Bowel sounds are normal. He exhibits no distension. There is no tenderness. There is no rebound and no guarding.  Musculoskeletal: He exhibits no edema and no tenderness.  Patient has discomfort with movement of all extremities, it is difficult  to localize tenderness in any particular region, he does attempt to move my hand away when I palpate either his left or right hip  Neurological: He is alert. He has normal strength. He is disoriented. No cranial nerve deficit (no facial droop, extraocular movements intact, no slurred speech) or sensory deficit. He exhibits normal muscle tone. He displays no seizure activity. Coordination normal. GCS eye subscore is 4. GCS verbal subscore is 3. GCS motor subscore is 5.  Patient moves all extremities in response to palpation he tries to move her hands away  Skin: Skin is warm and dry. No rash noted.  Psychiatric: He has a normal mood and affect.    ED Course  Procedures (including critical care time) Labs Review Labs Reviewed  BASIC METABOLIC PANEL - Abnormal; Notable for the following:    Glucose, Bld 114 (*)    BUN 31 (*)    GFR calc non Af Amer 66 (*)    GFR calc Af Amer 77 (*)    All other components within normal limits  CBC WITH DIFFERENTIAL - Abnormal; Notable for the following:    WBC 13.0 (*)    Neutro Abs 10.0 (*)    Lymphocytes Relative 9 (*)    Monocytes Relative 14 (*)    Monocytes Absolute 1.9 (*)    All other components within normal limits  PROTIME-INR  TYPE AND SCREEN   Imaging Review Dg Hip Bilateral W/pelvis  08/24/2013   CLINICAL DATA:  Left swelling  EXAM: BILATERAL HIP WITH PELVIS - 4+ VIEW  COMPARISON:  CT 08/14/2008  FINDINGS: There is no evidence of fracture, dislocation, or diastasis. Normal alignment and mineralization. Degenerative disc disease L5-S1.  IMPRESSION: 1. Negative for fracture or other acute bone abnormality. 2. Degenerative disc disease L5-S1.   Electronically Signed   By: Oley Balm M.D.   On: 08/24/2013 19:43   Ct Pelvis Wo Contrast  08/24/2013   CLINICAL DATA:  Fall, left hip deformity/pain  EXAM: CT PELVIS WITHOUT CONTRAST  TECHNIQUE: Multidetector CT imaging of the pelvis was performed following the standard protocol without intravenous  contrast.  COMPARISON:  Bilateral hip radiographs dated 08/24/2013  FINDINGS: No evidence of fracture or dislocation.  Bilateral hips and  visualized bony pelvis are intact.  Degenerative changes of the lower lumbar spine.  Prostate is unremarkable.  Bladder is within normal limits.  Vascular calcifications.  Small fat containing left inguinal hernia.  No pelvic ascites.  No suspicious pelvic lymphadenopathy.  IMPRESSION: No evidence of fracture or dislocation.   Electronically Signed   By: Charline Bills M.D.   On: 08/24/2013 20:58     MDM   Final diagnoses:  Dementia    Patient's x-rays as well as CAT scan did not show any evidence of fracture. The history and exam are difficult considering this patient's dementia. They do not report any known fall. At this point I doubt fracture or dislocation or any other emergency medical condition. The patient is stable to followup at the nursing facility.    Celene Kras, MD 08/24/13 [phone]

## 2013-08-24 NOTE — ED Notes (Signed)
Patient is alert and oriented x3.  He was given DC instructions and follow up visit instructions.  Patient gave verbal understanding.  He was DC ambulatory under his own power to home.  V/S stable.  He was not showing any signs of distress on DC 

## 2013-08-24 NOTE — ED Notes (Signed)
[facility] Per EMS, staff call and reports that pt has L hip deformity but denies fall or injury. Pt unable to give hx d/t advanced dementia. Pt grimaces to palpation of L hip. Pt in NAD

## 2013-08-24 NOTE — Discharge Instructions (Signed)
Alzheimer Disease Alzheimer Disease (AD) is a mental disorder. It causes memory loss and loss of other mental functions, such as learning, thinking, solving problems, communicating, and completing tasks. The mental losses interfere with the ability to perform daily activities at work, at home, or in social situations. AD usually starts in the late 60s or early 88s but can start earlier in life (familial form). The mental changes caused by AD are permanent and worsen over time. As the illness progresses, the ability to do even the simplest things is lost. Survival with AD ranges from several years to as long as 20 years. CAUSES AD is caused by abnormally high levels of a protein (beta-amyloid) in the brain. This protein forms very small deposits within and around the brain's nerve cells. These deposits prevent the nerve cells from working properly. Experts are not certain what causes the beta-amyloid deposits in AD. RISK FACTORS The following major risk factors have been identified:  Increasing age.  Certain genetic variations, such as Down syndrome (trisomy 21). SYMPTOMS The earliest mental change in AD is mild memory loss of recent events, names, or phone numbers. Other symptoms at the beginning of AD include loss of objects, minor loss of vocabulary, and difficulty with complex tasks, such as paying bills or driving in unfamiliar locations. At this stage, you are still able to perform daily activities but need greater effort, more time, or memory aids. Other mental functions deteriorate as AD worsens. These changes slowly go from mild to severe. Symptoms at this stage include:  Difficulty remembering You may not be able to recall personal information such as your address and telephone number. You may become confused about the date, the season of the year, or your location.  Difficulty maintaining attention You may forget what you wanted to say during conversations and repeat what you have already  said.  Difficulty learning new information or tasks You may not remember what you read or the name of a new friend you met.  Difficulty counting or doing math You may have difficulty with complex math problems. You may make mistakes in paying bills or managing your checkbook.  Poor reasoning and judgment You may make poor decisions or not dress right for the weather.  Difficulty communicating You may have regular difficulty remembering words, naming objects, expressing yourself clearly, or writing sentences that make sense.  Difficulty performing familiar daily activities You may get lost driving in familiar locations or need help eating, bathing, dressing, grooming, or using the toilet. You may have difficulty maintaining bladder or bowel control.  Difficulty recognizing familiar faces You may confuse family members or close friends with one another. You may not recognize a close relative or may mistake strangers for family. AD also may cause changes in personality and behavior. These changes include loss of interest or motivation, social withdrawal, anxiety, difficulty sleeping, uncharacteristic anger or combativeness, a false belief that someone is trying to harm you (paranoia), seeing things that are not real (hallucinations), or agitation. Confusion and disruptive behavior are often worse at night and may be triggered by changes in the environment or acute medical issues. DIAGNOSIS  AD is diagnosed through an assessment by your health care provider. During this assessment, your health care provider will do the following:  Ask you and your family, friends, or caregiver questions about your symptoms, their frequency, their duration and progression, and the effect they are having on your life.  Ask questions about your personal and family medical history and use  of alcohol or drugs, including prescription medicine.  Perform a physical exam and order blood tests and brain imaging exams. Your  health care provider may refer you to a specialist for detailed evaluation of your mental functions (neuropsychological testing).  Many different brain disorders, medical conditions, and certain substances can cause symptoms that resemble AD symptoms. These must be ruled out before AD can be diagnosed. If AD is diagnosed, it will be considered either "possible" or "probable" AD. "Possible" AD means that your symptoms are typical of AD and no other disorder is causing them. "Probable" AD means that you also have a family history of AD or genetic test results that support the diagnosis. Certain tests, mostly used in research studies, are highly specific for AD.  TREATMENT  There is currently no cure for AD. The goals of treatment are to:  Slow down the progression of the disease.  Preserve mental function as long as possible.  Manage behavioral symptoms.  Make life easier for the person with AD and their caregivers. The following treatment options are available:  Medicine Certain medicines may help slow memory loss by changing the level of certain chemicals in the brain. Medicine may also help with behavioral symptoms.  Talk therapy Talk therapy provides education, support, and memory aids for people with AD. It is most effective in the early stages of the illness.  Caregiving Caregivers may be family members, friends, or trained medical professionals. They help the person with AD with daily life activities. Caregiving may take place at home or at a nursing facility.  Family support groups These provide education, emotional support, and information about community resources to family members who are taking care of the person with AD. Document Released: 02/24/2004 Document Revised: 02/14/2013 Document Reviewed: 10/20/2012 [employer] Patient Information 2014 [employer].  

## 2013-08-24 NOTE — ED Notes (Signed)
Bed: [id number] Expected date:  Expected time:  Means of arrival:  Comments: ems- 80 yo, L hip deformity

## 2013-08-27 ENCOUNTER — Telehealth: Payer: Self-pay | Admitting: *Deleted

## 2013-08-27 NOTE — Telephone Encounter (Signed)
Form re-dated, signed and placed at front desk for pick up.

## 2013-08-27 NOTE — Telephone Encounter (Signed)
Received call from pt's son stating they are still looking for a skilled nursing facility to place pt at. They are [facility] and requesting another FL2 to pick up tomorrow. Advised pt it would be at front desk for pick up tomorrow.  Printed previous FL2 from December and forwarded to Provider to initial date change.

## 2013-08-28 ENCOUNTER — Inpatient Hospital Stay (HOSPITAL_COMMUNITY)
Admission: EM | Admit: 2013-08-28 | Discharge: 2013-09-01 | DRG: 640 | Disposition: A | Discharge status: Skilled Nursing Facility | Payer: Medicare Other | Attending: Internal Medicine | Admitting: Internal Medicine

## 2013-08-28 ENCOUNTER — Emergency Department (HOSPITAL_COMMUNITY): Payer: Medicare Other

## 2013-08-28 ENCOUNTER — Observation Stay (HOSPITAL_COMMUNITY): Payer: Medicare Other

## 2013-08-28 ENCOUNTER — Encounter (HOSPITAL_COMMUNITY): Payer: Self-pay | Admitting: Emergency Medicine

## 2013-08-28 DIAGNOSIS — R52 Pain, unspecified: Secondary | ICD-10-CM | POA: Diagnosis present

## 2013-08-28 DIAGNOSIS — R5381 Other malaise: Secondary | ICD-10-CM

## 2013-08-28 DIAGNOSIS — I10 Essential (primary) hypertension: Secondary | ICD-10-CM | POA: Diagnosis present

## 2013-08-28 DIAGNOSIS — H409 Unspecified glaucoma: Secondary | ICD-10-CM | POA: Diagnosis present

## 2013-08-28 DIAGNOSIS — G9340 Encephalopathy, unspecified: Secondary | ICD-10-CM | POA: Diagnosis present

## 2013-08-28 DIAGNOSIS — G309 Alzheimer's disease, unspecified: Secondary | ICD-10-CM | POA: Diagnosis present

## 2013-08-28 DIAGNOSIS — J310 Chronic rhinitis: Secondary | ICD-10-CM | POA: Diagnosis present

## 2013-08-28 DIAGNOSIS — K4090 Unilateral inguinal hernia, without obstruction or gangrene, not specified as recurrent: Secondary | ICD-10-CM

## 2013-08-28 DIAGNOSIS — G4700 Insomnia, unspecified: Secondary | ICD-10-CM

## 2013-08-28 DIAGNOSIS — I2510 Atherosclerotic heart disease of native coronary artery without angina pectoris: Secondary | ICD-10-CM | POA: Diagnosis present

## 2013-08-28 DIAGNOSIS — Z87891 Personal history of nicotine dependence: Secondary | ICD-10-CM

## 2013-08-28 DIAGNOSIS — M25569 Pain in unspecified knee: Secondary | ICD-10-CM

## 2013-08-28 DIAGNOSIS — R531 Weakness: Secondary | ICD-10-CM

## 2013-08-28 DIAGNOSIS — R634 Abnormal weight loss: Secondary | ICD-10-CM

## 2013-08-28 DIAGNOSIS — K219 Gastro-esophageal reflux disease without esophagitis: Secondary | ICD-10-CM | POA: Diagnosis present

## 2013-08-28 DIAGNOSIS — M47812 Spondylosis without myelopathy or radiculopathy, cervical region: Secondary | ICD-10-CM | POA: Diagnosis present

## 2013-08-28 DIAGNOSIS — Z9861 Coronary angioplasty status: Secondary | ICD-10-CM

## 2013-08-28 DIAGNOSIS — E785 Hyperlipidemia, unspecified: Secondary | ICD-10-CM | POA: Diagnosis present

## 2013-08-28 DIAGNOSIS — R5383 Other fatigue: Secondary | ICD-10-CM

## 2013-08-28 DIAGNOSIS — E870 Hyperosmolality and hypernatremia: Principal | ICD-10-CM | POA: Diagnosis present

## 2013-08-28 DIAGNOSIS — F4320 Adjustment disorder, unspecified: Secondary | ICD-10-CM

## 2013-08-28 DIAGNOSIS — E860 Dehydration: Secondary | ICD-10-CM

## 2013-08-28 DIAGNOSIS — R262 Difficulty in walking, not elsewhere classified: Secondary | ICD-10-CM | POA: Diagnosis present

## 2013-08-28 DIAGNOSIS — F0390 Unspecified dementia without behavioral disturbance: Secondary | ICD-10-CM

## 2013-08-28 DIAGNOSIS — K922 Gastrointestinal hemorrhage, unspecified: Secondary | ICD-10-CM

## 2013-08-28 DIAGNOSIS — R0789 Other chest pain: Secondary | ICD-10-CM

## 2013-08-28 DIAGNOSIS — F0280 Dementia in other diseases classified elsewhere without behavioral disturbance: Secondary | ICD-10-CM | POA: Diagnosis present

## 2013-08-28 LAB — CBC WITH DIFFERENTIAL/PLATELET
Basophils Absolute: 0 10*3/uL (ref 0.0–0.1)
Basophils Relative: 0 % (ref 0–1)
Eosinophils Absolute: 0.1 10*3/uL (ref 0.0–0.7)
Eosinophils Relative: 0 % (ref 0–5)
HCT: 41.4 % (ref 39.0–52.0)
HEMOGLOBIN: 13.8 g/dL (ref 13.0–17.0)
LYMPHS ABS: 1.3 10*3/uL (ref 0.7–4.0)
Lymphocytes Relative: 9 % — ABNORMAL LOW (ref 12–46)
MCH: 30.9 pg (ref 26.0–34.0)
MCHC: 33.3 g/dL (ref 30.0–36.0)
MCV: 92.8 fL (ref 78.0–100.0)
Monocytes Absolute: 1.5 10*3/uL — ABNORMAL HIGH (ref 0.1–1.0)
Monocytes Relative: 11 % (ref 3–12)
NEUTROS PCT: 80 % — AB (ref 43–77)
Neutro Abs: 11.1 10*3/uL — ABNORMAL HIGH (ref 1.7–7.7)
Platelets: 259 10*3/uL (ref 150–400)
RBC: 4.46 MIL/uL (ref 4.22–5.81)
RDW: 13.7 % (ref 11.5–15.5)
WBC: 13.9 10*3/uL — AB (ref 4.0–10.5)

## 2013-08-28 LAB — COMPREHENSIVE METABOLIC PANEL
ALK PHOS: 55 U/L (ref 39–117)
ALT: 14 U/L (ref 0–53)
AST: 23 U/L (ref 0–37)
Albumin: 2.3 g/dL — ABNORMAL LOW (ref 3.5–5.2)
BILIRUBIN TOTAL: 0.7 mg/dL (ref 0.3–1.2)
BUN: 33 mg/dL — ABNORMAL HIGH (ref 6–23)
CHLORIDE: 115 meq/L — AB (ref 96–112)
CO2: 26 mEq/L (ref 19–32)
Calcium: 9.6 mg/dL (ref 8.4–10.5)
Creatinine, Ser: 0.79 mg/dL (ref 0.50–1.35)
GFR calc non Af Amer: 81 mL/min — ABNORMAL LOW (ref >90)
GLUCOSE: 98 mg/dL (ref 70–99)
POTASSIUM: 3.9 meq/L (ref 3.7–5.3)
Sodium: 154 mEq/L — ABNORMAL HIGH (ref 137–147)
TOTAL PROTEIN: 6.8 g/dL (ref 6.0–8.3)

## 2013-08-28 LAB — URINALYSIS, ROUTINE W REFLEX MICROSCOPIC
Glucose, UA: NEGATIVE mg/dL
Hgb urine dipstick: NEGATIVE
KETONES UR: 15 mg/dL — AB
LEUKOCYTES UA: NEGATIVE
Nitrite: NEGATIVE
PH: 5.5 (ref 5.0–8.0)
PROTEIN: NEGATIVE mg/dL
Specific Gravity, Urine: 1.035 — ABNORMAL HIGH (ref 1.005–1.030)
Urobilinogen, UA: 1 mg/dL (ref 0.0–1.0)

## 2013-08-28 MED ORDER — OCUVITE PO TABS
1.0000 | ORAL_TABLET | Freq: Every day | ORAL | Status: DC
  Administered 2013-08-30 – 2013-09-01 (×3): 1 via ORAL
  Filled 2013-08-28 (×4): qty 1

## 2013-08-28 MED ORDER — DIVALPROEX SODIUM 125 MG PO CPSP
375.0000 mg | ORAL_CAPSULE | Freq: Three times a day (TID) | ORAL | Status: DC
  Administered 2013-08-29 – 2013-09-01 (×7): 375 mg via ORAL
  Filled 2013-08-28 (×13): qty 3

## 2013-08-28 MED ORDER — CEFEPIME HCL 1 G IJ SOLR
1.0000 g | Freq: Two times a day (BID) | INTRAMUSCULAR | Status: DC
  Administered 2013-08-29 – 2013-08-31 (×6): 1 g via INTRAVENOUS
  Filled 2013-08-28 (×7): qty 1

## 2013-08-28 MED ORDER — SODIUM CHLORIDE 0.9 % IV SOLN
INTRAVENOUS | Status: DC
  Administered 2013-08-28: via INTRAVENOUS

## 2013-08-28 MED ORDER — CYANOCOBALAMIN 500 MCG PO TABS
500.0000 ug | ORAL_TABLET | Freq: Every day | ORAL | Status: DC
  Administered 2013-08-30 – 2013-09-01 (×3): 500 ug via ORAL
  Filled 2013-08-28 (×4): qty 1

## 2013-08-28 MED ORDER — LORAZEPAM 2 MG/ML IJ SOLN
1.0000 mg | Freq: Once | INTRAMUSCULAR | Status: AC
  Administered 2013-08-28: 1 mg via INTRAVENOUS
  Filled 2013-08-28: qty 1

## 2013-08-28 MED ORDER — VANCOMYCIN HCL IN DEXTROSE 750-5 MG/150ML-% IV SOLN
750.0000 mg | Freq: Two times a day (BID) | INTRAVENOUS | Status: DC
  Administered 2013-08-29 – 2013-08-31 (×4): 750 mg via INTRAVENOUS
  Filled 2013-08-28 (×8): qty 150

## 2013-08-28 MED ORDER — SODIUM CHLORIDE 0.9 % IJ SOLN
3.0000 mL | Freq: Two times a day (BID) | INTRAMUSCULAR | Status: DC
  Administered 2013-08-29 – 2013-09-01 (×3): 3 mL via INTRAVENOUS

## 2013-08-28 MED ORDER — IOHEXOL 300 MG/ML  SOLN
100.0000 mL | Freq: Once | INTRAMUSCULAR | Status: AC | PRN
  Administered 2013-08-28: 80 mL via INTRAVENOUS

## 2013-08-28 MED ORDER — VITAMIN D (ERGOCALCIFEROL) 1.25 MG (50000 UNIT) PO CAPS: 50000.0000 [IU] | ORAL_CAPSULE | ORAL | Status: DC

## 2013-08-28 MED ORDER — LATANOPROST 0.005 % OP SOLN
1.0000 [drp] | Freq: Every day | OPHTHALMIC | Status: DC
  Administered 2013-08-29 – 2013-08-31 (×3): 1 [drp] via OPHTHALMIC
  Filled 2013-08-28: qty 2.5

## 2013-08-28 MED ORDER — IOHEXOL 300 MG/ML  SOLN: 25.0000 mL | INTRAMUSCULAR | Status: AC

## 2013-08-28 MED ORDER — SODIUM CHLORIDE 0.9 % IV BOLUS (SEPSIS)
1000.0000 mL | Freq: Once | INTRAVENOUS | Status: AC
  Administered 2013-08-28: 1000 mL via INTRAVENOUS

## 2013-08-28 MED ORDER — QUETIAPINE FUMARATE 50 MG PO TABS
50.0000 mg | ORAL_TABLET | Freq: Two times a day (BID) | ORAL | Status: DC
  Administered 2013-08-29 – 2013-09-01 (×6): 50 mg via ORAL
  Filled 2013-08-28 (×8): qty 1

## 2013-08-28 MED ORDER — SODIUM CHLORIDE 0.9 % IV SOLN: INTRAVENOUS | Status: AC

## 2013-08-28 NOTE — ED Notes (Signed)
Pt moves around too much in xray, given 1 ativan to help calm pt down. Will retry xray. Pt transported to radiology.

## 2013-08-28 NOTE — ED Provider Notes (Signed)
TIME SEEN: 6:17 PM  CHIEF COMPLAINT: Weakness, patient is unable to walk  HPI: Patient is a 80 y.o. M with history of severe dementia, hypertension, CAD, hyperlipidemia who lives at [facility] nursing facility who presents to the emergency department with difficulty walking for the past several days. He was seen in the emergency Department at [facility] on 08/24/13 for difficulty walking and a bruise noted on his left hip. There is no known injury. He had a x-ray and CT scan of his left hip which showed no fracture dislocation. His labs were also unremarkable. He was sent back to the nursing facility. Family reports that normally he is able to angulate with minimal assistance but he has not been walking for the past several days. They deny that he has had any fevers, cough, vomiting or diarrhea and do not know of any head injury.  ROS: Unobtainable secondary to patient's dementia  PAST MEDICAL HISTORY/PAST SURGICAL HISTORY:  Past Medical History  Diagnosis Date  . GERD (gastroesophageal reflux disease)   . Hypertension   . History of GI bleed     multiple distal duodenal and general diverticula  . Dementia   . Glaucoma   . CAD (coronary artery disease)   . Hyperlipidemia   . Alzheimer disease     MEDICATIONS:  Prior to Admission medications   Medication Sig Start Date End Date Taking? Authorizing Provider  acetaminophen (TYLENOL) 500 MG tablet Take 500 mg by mouth every 6 (six) hours as needed for mild pain or fever.    Yes Historical Provider, MD  beta carotene w/minerals (OCUVITE) tablet Take 1 tablet by mouth daily. W/Lutein.   Yes Historical Provider, MD  divalproex (DEPAKOTE SPRINKLE) 125 MG capsule Take 375 mg by mouth 3 (three) times daily.   Yes Historical Provider, MD  fluticasone (FLONASE) 50 MCG/ACT nasal spray Place 2 sprays into the nose daily.   Yes Historical Provider, MD  latanoprost (XALATAN) 0.005 % ophthalmic solution Place 1 drop into both eyes at bedtime.   Yes  Historical Provider, MD  Nutritional Supplements (NUTRA SHAKE PO) Take 1 Bottle by mouth 3 (three) times daily.   Yes Historical Provider, MD  QUEtiapine (SEROQUEL) 50 MG tablet Take 50 mg by mouth 2 (two) times daily.   Yes Historical Provider, MD  vitamin B-12 (CYANOCOBALAMIN) 500 MCG tablet Take 500 mcg by mouth daily.   Yes Historical Provider, MD  Vitamin D, Ergocalciferol, (DRISDOL) 50000 UNITS CAPS capsule Take 50,000 Units by mouth every 7 (seven) days. Mondays.   Yes Historical Provider, MD    ALLERGIES:  Allergies  Allergen Reactions  . Geodon [Ziprasidone Hcl] Other (See Comments)    Per nursing home MAR.   Marland Kitchen Penicillins Other (See Comments)    Per nursing home MAR.      SOCIAL HISTORY:  History  Substance Use Topics  . Smoking status: Former Games developer  . Smokeless tobacco: Never Used  . Alcohol Use: No    FAMILY HISTORY: Family History  Problem Relation Age of Onset  . Cancer Son     kidney    EXAM: BP 111/87  Pulse 88  Temp(Src) 99.1 F (37.3 C) (Oral)  Resp 17  SpO2 99% CONSTITUTIONAL: Alert but unable to answer questions or follow commands. Severely demented. Elderly. No apparent distress.  HEAD: Normocephalic EYES: Conjunctivae clear, PERRL ENT: normal nose; no rhinorrhea; dry mucous membranes; pharynx without lesions noted NECK: Supple, no meningismus, no LAD  CARD: RRR; S1 and S2 appreciated; no  murmurs, no clicks, no rubs, no gallops RESP: Normal chest excursion without splinting or tachypnea; breath sounds clear and equal bilaterally; no wheezes, no rhonchi, no rales,  ABD/GI: Normal bowel sounds; non-distended; soft, non-tender, no rebound, no guarding BACK:  The back appears normal and is non-tender to palpation, there is no CVA tenderness EXT: , Patient has some swelling over his left knee with tenderness to palpation over the medial aspect, no tenderness over the left hip or lumbar spine, 2+ DP pulses bilaterally, feet are warm and well-perfused, no  calf pain or tenderness, exam is limited as patient becomes agitated with any manipulation or contact, Normal ROM in all joints;  no edema; normal capillary refill; no cyanosis    SKIN: Normal color for age and race; warm NEURO: Moves all extremities equally, no obvious facial droop PSYCH: The patient's mood and manner are appropriate. Grooming and personal hygiene are appropriate.  MEDICAL DECISION MAKING: Patient here with difficulty walking for the past several days. It is unclear if this is do to an injury versus organic causes. We'll repeat labs, urine, chest x-ray. We'll also obtain head and cervical spine CT as well his left knee and lumbar spine x-rays to rule out injury. We'll give IV fluids as patient does appear dry on exam. Family states they are concerned that he may be dehydrated.  ED PROGRESS: Pt is hypernatremic, elevated BUN, ketones in his urine - suspect dehydration.  Will continue IVF.  Patient also has a leukocytosis but no obvious infection. Chest x-ray, urinalysis showed no sign of infection. X-ray show no sign of injury. CT head and cervical spine show no acute abnormality. Family reports they were seen by Sandford CrazeMelissa O'S[facility].  Will discuss with hospitalist for admission.     EKG Interpretation  Date/Time:  Tuesday August 28 2013 16:54:48 EST Ventricular Rate:  88 PR Interval:  115 QRS Duration: 86 QT Interval:  369 QTC Calculation: 446 R Axis:   53 Text Interpretation:  Sinus rhythm Borderline short PR interval No significant change since last tracing Confirmed by Quinnten Calvin,  DO, Pleshette Tomasini [phone]([id number]) on 08/28/2013 5:06:02 PM        Layla MawKristen N Noe Pittsley, DO 08/28/13 2052

## 2013-08-28 NOTE — ED Notes (Signed)
Per ems, [facility], pt has had a generalized decrease in energy for the last three for days.. Pt normally walks, unsure if with walker. [facility]. No injury noted by ems. Pt has dementia and was sent home. Pt current mentation is at baseline. Family denies recent falls or recent medication changes. Pt alert and verbal. CBG 126

## 2013-08-28 NOTE — Progress Notes (Signed)
ANTIBIOTIC CONSULT NOTE - INITIAL  Pharmacy Consult for Vancomycin/Cefepime Indication: fever, leukocytosis  Allergies  Allergen Reactions  . Geodon [Ziprasidone Hcl] Other (See Comments)    Per nursing home MAR.   Marland Kitchen. Penicillins Other (See Comments)    Per nursing home MAR.      Patient Measurements: ~60 kg  Vital Signs: Temp: 99.2 F (37.3 C) (03/03 1841) Temp src: Rectal (03/03 1841) BP: 120/62 mmHg (03/03 1930) Pulse Rate: 85 (03/03 1930)  Labs:  Recent Labs  08/28/13 1836  WBC 13.9*  HGB 13.8  PLT 259  CREATININE 0.79   The CrCl is unknown because both a height and weight (above a minimum accepted value) are required for this calculation. No results found for this basename: VANCOTROUGH, VANCOPEAK, VANCORANDOM, GENTTROUGH, GENTPEAK, GENTRANDOM, TOBRATROUGH, TOBRAPEAK, TOBRARND, AMIKACINPEAK, AMIKACINTROU, AMIKACIN,  in the last 72 hours   Microbiology: No results found for this or any previous visit (from the past 720 hour(s)).  Medical History: Past Medical History  Diagnosis Date  . GERD (gastroesophageal reflux disease)   . Hypertension   . History of GI bleed     multiple distal duodenal and general diverticula  . Dementia   . Glaucoma   . CAD (coronary artery disease)   . Hyperlipidemia   . Alzheimer disease    Assessment: 80 y/o M from nursing home with fever, confusion, leukocytosis, to start broad spectrum antibiotics, renal function ok, other labs as above.   Goal of Therapy:  Vancomycin trough level 15-20 mcg/ml  Plan:  -Vancomycin 750 mg IV q12h -Cefepime 1g IV q12h -Trend WBC, temp, renal function  -Drug levels as indicated   Abran DukeLedford, Kyren Knick 08/28/2013,10:55 PM

## 2013-08-28 NOTE — H&P (Signed)
Hospitalist Admission History and Physical  Patient name: [facility] Medical record number: [id number] Date of birth: Sep 16, 1928 Age: 80 y.o. Gender: male  Primary Care Provider: Lemont Fillers., NP  Chief Complaint: confusion, inability to ambulate, dehydration History of Present Illness:This is a 80 y.o. year old male with prior hx/o severe dementia, agitation, HTN, remote hx/o CAD s/p stent  presenting with worsening confusion, inability to ambulate dehydration. Level V caveat 2/2 dementia. Pt's daughter and son primary historians. Pt is a nursing home resident. Family brought pt to ER 4-5 days ago for L sided hip pain with inability to ambulate. A CT hip was obtained that was negative for fracture. Per the family, there was no reported fall at the nursing home. Family states that pt has been generally lethargic over the past week, which is off of pt's baseline. Pt is non conversive, but does sing at baseline. Pt has not been doing this over past week. Today, family states that pt spiked a fever in the nursing home. Pt has also had ? Decreased appetite. No cough. No recent changes in medication. Pt alsow with generalized pain diffusely.  Pt subsequently brought back to the ER because of fever.   In the ER today, tmax 99.2. WBC @ 13.9. CXR and UA negative for any overt signs of infection. Na @ 154. BUN elevated @ 33.  Imaging obtained. L knee xray showing degenerative changes w/ ? CPPD-no effusion. L spine negative for fracture with degenerative changes. CT of head and neck with small subq posterior hematoma, cervical spondylosis, stenosis, and degenerative. Noted remote R internal capsule infarct with no evidence of large acute infarct.    Patient Active Problem List   Diagnosis Date Noted  . Encephalopathy 08/28/2013  . Dementia 04/07/2011  . Weight loss 04/07/2011  . Rhinitis 04/07/2011  . INGUINAL HERNIA, LEFT 08/20/2010  . ADJUSTMENT DISORDER WITHOUT DEPRESSED MOOD 07/16/2010  .  CHEST PAIN, ATYPICAL 02/16/2010  . KNEE PAIN, RIGHT 08/15/2009  . HYPERLIPIDEMIA 06/09/2009  . INSOMNIA, CHRONIC 06/09/2009  . HYPERTENSION 06/09/2009  . CORONARY ARTERY DISEASE 06/09/2009  . GERD 06/09/2009  . GASTROINTESTINAL HEMORRHAGE 06/09/2009   Past Medical History: Past Medical History  Diagnosis Date  . GERD (gastroesophageal reflux disease)   . Hypertension   . History of GI bleed     multiple distal duodenal and general diverticula  . Dementia   . Glaucoma   . CAD (coronary artery disease)   . Hyperlipidemia   . Alzheimer disease     Past Surgical History: Past Surgical History  Procedure Laterality Date  . Cholecystectomy    . Back surgery      Social History: History   Social History  . Marital Status: Married    Spouse Name: N/A    Number of Children: 4  . Years of Education: N/A   Occupational History  . Retired Corporate investment banker    Social History Main Topics  . Smoking status: Former Games developer  . Smokeless tobacco: Never Used  . Alcohol Use: No  . Drug Use: No  . Sexual Activity: None   Other Topics Concern  . None   Social History Narrative  . None    Family History: Family History  Problem Relation Age of Onset  . Cancer Son     kidney    Allergies: Allergies  Allergen Reactions  . Geodon [Ziprasidone Hcl] Other (See Comments)    Per nursing home MAR.   Marland Kitchen Penicillins Other (See Comments)  Per nursing home MAR.      Current Facility-Administered Medications  Medication Dose Route Frequency Provider Last Rate Last Dose  . 0.9 %  sodium chloride infusion   Intravenous Continuous Kristen N Ward, DO      . 0.9 %  sodium chloride infusion   Intravenous Continuous Doree Albee, MD      . beta carotene w/minerals (OCUVITE) tablet 1 tablet  1 tablet Oral Daily Doree Albee, MD      . divalproex (DEPAKOTE SPRINKLE) capsule 375 mg  375 mg Oral TID Doree Albee, MD      . latanoprost (XALATAN) 0.005 % ophthalmic solution 1 drop  1 drop  Both Eyes QHS Doree Albee, MD      . QUEtiapine (SEROQUEL) tablet 50 mg  50 mg Oral BID Doree Albee, MD      . sodium chloride 0.9 % injection 3 mL  3 mL Intravenous Q12H Doree Albee, MD      . vitamin B-12 (CYANOCOBALAMIN) tablet 500 mcg  500 mcg Oral Daily Doree Albee, MD      . Vitamin D (Ergocalciferol) (DRISDOL) capsule 50,000 Units  50,000 Units Oral Q7 days Doree Albee, MD       Current Outpatient Prescriptions  Medication Sig Dispense Refill  . acetaminophen (TYLENOL) 500 MG tablet Take 500 mg by mouth every 6 (six) hours as needed for mild pain or fever.       . beta carotene w/minerals (OCUVITE) tablet Take 1 tablet by mouth daily. W/Lutein.      Marland Kitchen divalproex (DEPAKOTE SPRINKLE) 125 MG capsule Take 375 mg by mouth 3 (three) times daily.      . fluticasone (FLONASE) 50 MCG/ACT nasal spray Place 2 sprays into the nose daily.      Marland Kitchen latanoprost (XALATAN) 0.005 % ophthalmic solution Place 1 drop into both eyes at bedtime.      . Nutritional Supplements (NUTRA SHAKE PO) Take 1 Bottle by mouth 3 (three) times daily.      . QUEtiapine (SEROQUEL) 50 MG tablet Take 50 mg by mouth 2 (two) times daily.      . vitamin B-12 (CYANOCOBALAMIN) 500 MCG tablet Take 500 mcg by mouth daily.      . Vitamin D, Ergocalciferol, (DRISDOL) 50000 UNITS CAPS capsule Take 50,000 Units by mouth every 7 (seven) days. Mondays.       Review Of Systems: 12 point ROS negative except as noted above in HPI.  Physical Exam: Filed Vitals:   08/28/13 1841  BP:   Pulse:   Temp: 99.2 F (37.3 C)  Resp:     General: minimally cooperative to exam  HEENT: PERRLA and dry oral mucosa Heart: S1, S2 normal, no murmur, rub or gallop, regular rate and rhythm Lungs: clear to auscultation Abdomen:+ generalized abdominal TTP, + bowel sounds  Extremities: extremities normal, atraumatic, no cyanosis or edema and + generalized TTP in LEs bilaterally. No obvious effusions.  Skin:no rashes Neurology: normal without  focal findings  Labs and Imaging: Lab Results  Component Value Date/Time   NA 154* 08/28/2013  6:36 PM   K 3.9 08/28/2013  6:36 PM   CL 115* 08/28/2013  6:36 PM   CO2 26 08/28/2013  6:36 PM   BUN 33* 08/28/2013  6:36 PM   CREATININE 0.79 08/28/2013  6:36 PM   CREATININE 0.90 06/15/2013  1:58 PM   GLUCOSE 98 08/28/2013  6:36 PM   Lab Results  Component Value Date   WBC 13.9* 08/28/2013  HGB 13.8 08/28/2013   HCT 41.4 08/28/2013   MCV 92.8 08/28/2013   PLT 259 08/28/2013    Dg Chest 1 View  08/28/2013   CLINICAL DATA:  Weakness, confusion  EXAM: CHEST - 1 VIEW  COMPARISON:  07/12/2013  FINDINGS: Low lung volumes. Lungs are clear. Heart size and mediastinal contours are within normal limits. No effusion.  Surgical clips right upper abdomen. Visualized skeletal structures are unremarkable.  IMPRESSION: No acute cardiopulmonary disease.   Electronically Signed   By: Oley Balm M.D.   On: 08/28/2013 20:21   Dg Lumbar Spine Complete  08/28/2013   CLINICAL DATA:  Weakness, confusion  EXAM: LUMBAR SPINE - COMPLETE 4+ VIEW  COMPARISON:  CT 08/14/2008  FINDINGS: There is no evidence of lumbar spine fracture. Alignment is normal. Mild narrowing L4-5 and moderate narrowing L5-S1 interspaces. Patchy aortic calcifications. Surgical clips right upper abdomen.  IMPRESSION: 1. Negative for fracture or other acute bone abnormality. 2. Degenerative disc disease L4-S1.   Electronically Signed   By: Oley Balm M.D.   On: 08/28/2013 20:28   Ct Head Wo Contrast  08/28/2013   CLINICAL DATA:  Generalize decreased an NG over the past 3 days. Pain left leg. Dementia.  EXAM: CT HEAD WITHOUT CONTRAST  CT CERVICAL SPINE WITHOUT CONTRAST  TECHNIQUE: Multidetector CT imaging of the head and cervical spine was performed following the standard protocol without intravenous contrast. Multiplanar CT image reconstructions of the cervical spine were also generated.  COMPARISON:  02/27/2013.  FINDINGS: CT HEAD FINDINGS  Small subcutaneous  hematoma right occipital region may be present (series 2 image 13).  No skull fracture or intracranial hemorrhage.  Global atrophy without hydrocephalus.  Small vessel disease type changes. Remote infarct anterior limb right internal capsule. No CT evidence of large acute infarct.  No intracranial mass lesion noted on this unenhanced exam.  CT CERVICAL SPINE FINDINGS  No cervical spine fracture.  Cervical spondylotic changes including transverse ligament hypertrophy. Minimal anterior slip of C5 felt to be related to degenerative changes. No abnormal prevertebral soft tissue swelling. If ligamentous injury or cord injury were of high clinical concern, MR imaging could be obtained for further delineation.  IMPRESSION: CT HEAD :  Small subcutaneous hematoma right occipital region may be present (series 2 image 13).  No skull fracture or intracranial hemorrhage.  Global atrophy without hydrocephalus.  Small vessel disease type changes. Remote infarct anterior limb right internal capsule. No CT evidence of large acute infarct.  CT CERVICAL SPINE :  No cervical spine fracture.  Cervical spondylotic changes including transverse ligament hypertrophy. Spinal stenosis most notable C3-4 and C5-6.  Minimal anterior slip of C5 felt to be related to degenerative changes.   Electronically Signed   By: Bridgett Larsson M.D.   On: 08/28/2013 19:06   Ct Cervical Spine Wo Contrast  08/28/2013   CLINICAL DATA:  Generalize decreased an NG over the past 3 days. Pain left leg. Dementia.  EXAM: CT HEAD WITHOUT CONTRAST  CT CERVICAL SPINE WITHOUT CONTRAST  TECHNIQUE: Multidetector CT imaging of the head and cervical spine was performed following the standard protocol without intravenous contrast. Multiplanar CT image reconstructions of the cervical spine were also generated.  COMPARISON:  02/27/2013.  FINDINGS: CT HEAD FINDINGS  Small subcutaneous hematoma right occipital region may be present (series 2 image 13).  No skull fracture or  intracranial hemorrhage.  Global atrophy without hydrocephalus.  Small vessel disease type changes. Remote infarct anterior limb right internal capsule. No  CT evidence of large acute infarct.  No intracranial mass lesion noted on this unenhanced exam.  CT CERVICAL SPINE FINDINGS  No cervical spine fracture.  Cervical spondylotic changes including transverse ligament hypertrophy. Minimal anterior slip of C5 felt to be related to degenerative changes. No abnormal prevertebral soft tissue swelling. If ligamentous injury or cord injury were of high clinical concern, MR imaging could be obtained for further delineation.  IMPRESSION: CT HEAD :  Small subcutaneous hematoma right occipital region may be present (series 2 image 13).  No skull fracture or intracranial hemorrhage.  Global atrophy without hydrocephalus.  Small vessel disease type changes. Remote infarct anterior limb right internal capsule. No CT evidence of large acute infarct.  CT CERVICAL SPINE :  No cervical spine fracture.  Cervical spondylotic changes including transverse ligament hypertrophy. Spinal stenosis most notable C3-4 and C5-6.  Minimal anterior slip of C5 felt to be related to degenerative changes.   Electronically Signed   By: Bridgett LarssonSteve  Olson M.D.   On: 08/28/2013 19:06   Dg Knee Complete 4 Views Left  08/28/2013   CLINICAL DATA:  Weakness, confusion  EXAM: LEFT KNEE - COMPLETE 4+ VIEW  COMPARISON:  None.  FINDINGS: Narrowing of the articular cartilage in the medial compartment. Chondrocalcinosis in medial and lateral compartments. Small marginal spurs from the medial femoral condyles and tibial plateau as well as patellar articular surface. Negative for fracture or dislocation. Diffuse osteopenia. No effusion.  IMPRESSION: 1. Negative for fracture or other acute bone abnormality. 2. Tricompartmental degenerative changes with chondrocalcinosis, suggesting CPPD.   Electronically Signed   By: Oley Balmaniel  Hassell M.D.   On: 08/28/2013 20:27   EKG:  NSR   Assessment and Plan: [facility] is a 80 y.o. year old male presenting with encephalopathy, dehydration, generalized pain   Encephalopathy: Likely multifactorial with contributions of dehydration, recent likely fall, possible infection. Gently hydrate pt with NS. Start on vanc and zosyn pending blood and urine cxs. No overt signs of infection currently. No clinical signs of soft tissue/MSK infection. Check CT abd and pelvis to r/o intraabdominal source of sxs. Noted soft tissue hematoma on imaging. Fall most likely etiology. Small internal capsule lesion. Unclear if this is new CVA. Check MRI brain to coorelate. Continue baby ASA in the interim.   CAD: No clear indications of ACS. EKG NSR. Cycle CEs.   Hypernatremia: likely 2/2 to dehydration. Gently hydrate and reassess in am.   Dementia: per the family, depakote and seroquel are used for this. No hx/o seizure in the past. Continue this in house.   FEN/GI: gentle hydration with normal saline. ADAT pending bedside swallow eval.  Prophylaxis: SCDs given soft tissue hematoma. May add on sub q heparin in am.  Disposition: pending further evaluation. Family would like everything reasonable done.  Code Status:Full code.        Doree AlbeeSteven Marietta Sikkema MD  Pager: [phone]

## 2013-08-29 ENCOUNTER — Observation Stay (HOSPITAL_COMMUNITY): Payer: Medicare Other

## 2013-08-29 DIAGNOSIS — E870 Hyperosmolality and hypernatremia: Principal | ICD-10-CM

## 2013-08-29 DIAGNOSIS — E860 Dehydration: Secondary | ICD-10-CM

## 2013-08-29 DIAGNOSIS — F0390 Unspecified dementia without behavioral disturbance: Secondary | ICD-10-CM

## 2013-08-29 DIAGNOSIS — G9340 Encephalopathy, unspecified: Secondary | ICD-10-CM

## 2013-08-29 LAB — COMPREHENSIVE METABOLIC PANEL
ALT: 11 U/L (ref 0–53)
AST: 20 U/L (ref 0–37)
Albumin: 2 g/dL — ABNORMAL LOW (ref 3.5–5.2)
Alkaline Phosphatase: 52 U/L (ref 39–117)
BUN: 25 mg/dL — AB (ref 6–23)
CALCIUM: 9.2 mg/dL (ref 8.4–10.5)
CO2: 27 mEq/L (ref 19–32)
Chloride: 115 mEq/L — ABNORMAL HIGH (ref 96–112)
Creatinine, Ser: 0.63 mg/dL (ref 0.50–1.35)
GFR calc Af Amer: >90 mL/min (ref >90)
GFR, EST NON AFRICAN AMERICAN: 89 mL/min — AB (ref >90)
GLUCOSE: 84 mg/dL (ref 70–99)
Potassium: 3.7 mEq/L (ref 3.7–5.3)
SODIUM: 154 meq/L — AB (ref 137–147)
Total Bilirubin: 0.7 mg/dL (ref 0.3–1.2)
Total Protein: 6 g/dL (ref 6.0–8.3)

## 2013-08-29 LAB — TROPONIN I
Troponin I: <0.3 ng/mL (ref <0.30)
Troponin I: <0.3 ng/mL (ref <0.30)
Troponin I: <0.3 ng/mL (ref <0.30)

## 2013-08-29 LAB — CBC WITH DIFFERENTIAL/PLATELET
BASOS ABS: 0 10*3/uL (ref 0.0–0.1)
BASOS PCT: 0 % (ref 0–1)
Eosinophils Absolute: 0.1 10*3/uL (ref 0.0–0.7)
Eosinophils Relative: 1 % (ref 0–5)
HCT: 38.8 % — ABNORMAL LOW (ref 39.0–52.0)
Hemoglobin: 12.9 g/dL — ABNORMAL LOW (ref 13.0–17.0)
Lymphocytes Relative: 9 % — ABNORMAL LOW (ref 12–46)
Lymphs Abs: 1 10*3/uL (ref 0.7–4.0)
MCH: 31.1 pg (ref 26.0–34.0)
MCHC: 33.2 g/dL (ref 30.0–36.0)
MCV: 93.5 fL (ref 78.0–100.0)
Monocytes Absolute: 1.3 10*3/uL — ABNORMAL HIGH (ref 0.1–1.0)
Monocytes Relative: 12 % (ref 3–12)
NEUTROS ABS: 8.9 10*3/uL — AB (ref 1.7–7.7)
Neutrophils Relative %: 79 % — ABNORMAL HIGH (ref 43–77)
Platelets: 259 10*3/uL (ref 150–400)
RBC: 4.15 MIL/uL — ABNORMAL LOW (ref 4.22–5.81)
RDW: 13.7 % (ref 11.5–15.5)
WBC: 11.3 10*3/uL — ABNORMAL HIGH (ref 4.0–10.5)

## 2013-08-29 LAB — URINE CULTURE
COLONY COUNT: NO GROWTH
CULTURE: NO GROWTH

## 2013-08-29 LAB — MRSA PCR SCREENING: MRSA by PCR: NEGATIVE

## 2013-08-29 MED ORDER — LORAZEPAM 2 MG/ML IJ SOLN
INTRAMUSCULAR | Status: AC
  Administered 2013-08-29: 0.25 mg
  Filled 2013-08-29: qty 1

## 2013-08-29 MED ORDER — POLYETHYLENE GLYCOL 3350 17 G PO PACK
17.0000 g | PACK | Freq: Two times a day (BID) | ORAL | Status: DC
  Administered 2013-08-29 – 2013-09-01 (×6): 17 g via ORAL
  Filled 2013-08-29 (×8): qty 1

## 2013-08-29 MED ORDER — LORAZEPAM 2 MG/ML IJ SOLN
0.2500 mg | Freq: Once | INTRAMUSCULAR | Status: AC
  Administered 2013-08-29: 0.25 mg via INTRAVENOUS

## 2013-08-29 MED ORDER — DEXTROSE 5 % IV SOLN
INTRAVENOUS | Status: DC
  Administered 2013-08-29: 08:00:00 via INTRAVENOUS
  Filled 2013-08-29: qty 1000

## 2013-08-29 MED ORDER — RESOURCE THICKENUP CLEAR PO POWD
ORAL | Status: DC | PRN
  Filled 2013-08-29: qty 125

## 2013-08-29 NOTE — Progress Notes (Signed)
Clinical Social Work Department BRIEF PSYCHOSOCIAL ASSESSMENT 08/29/2013  Patient:  Nathaniel Stone,Nathaniel Stone     Account Number:  [id number]     Admit date:  08/28/2013  Clinical Social Worker:  Nathaniel Stone,Nathaniel Stone, LCSWA  Date/Time:  08/29/2013 11:00 AM  Referred by:  Physician  Date Referred:  08/29/2013 Referred for  [facility] Placement   Other Referral:   Interview type:  Family Other interview type:    PSYCHOSOCIAL DATA Living Status:  FACILITY Admitted from facility:  OTHER Level of care:  Assisted Living Primary support name:  [facility] [phone] Primary support relationship to patient:  CHILD, ADULT Degree of support available:   Pt have very supportive family    CURRENT CONCERNS Current Concerns  Post-Acute Placement   Other Concerns:    SOCIAL WORK ASSESSMENT / PLAN CSW informed that pt was admitted from facility. CSW spoke with pt sons Nathaniel Stone and Nathaniel Stone [phone]) at pt bedside. Pt sons informed CSW that pt is a resident at [facility] but they are unhappy with care and want pt to be transitioned to [facility]. CSW asked if pt sons are unhappy with ALF level of care or specifically this facility. Pt sons feel that both are an issue. Pt sons informed CSW that MD has made them aware that pt current status is likely due to fall/s but facility has no incident reports and they are concerned that facility does not provided enough supervision/support. Pt sons have been looking at SNFs for some time now.    CSW explained to pt sons that pt will need a 3 night qualifying stay in order for pt to dc to [facility] under Medicare and that as of right now medical team is unsure if pt will have this. Pt sons understanding but dissappointed. Pt sons say that they have actively been trying to apply for Medicaid but pt social security check is to high and pt does not qualify.    Pt sons are wondering if CSW can help pt be moved to alternative ALF if [facility] is not an option. CSW explained that while CSW can assist the admission  process for ALF can be lengthier so pt may need to dc back to [facility] while pt family continues to work on transitioning to another facility. Pt sons said they would be thankful for anything CSW could assist with. CSW asked if family had another ALF in mind and they said they did not as they had only been looking at SNFs. CSW suggested pt family consider a facility that offers multiple levels of care so that if pt does qualify for Medicaid or have qualifying stay later on he can potentially transfer within facility. Pt sons very thankful for this idea. CSW offered list of facilities that have this option, pt sons have been looking at [facility] and talking to their admissions office. CSW to look into option of ALF at [facility] if pt does not have a 3 night qualifying stay this admission.   Assessment/plan status:  Psychosocial Support/Ongoing Assessment of Needs Other assessment/ plan:   Information/referral to community resources:   [facility] and ALF list    PATIENT'S/FAMILY'S RESPONSE TO PLAN OF CARE: Pt sons unsure of dc plan at this time but are open to all suggestions       Nathaniel Stone, LCSWA [phone]

## 2013-08-29 NOTE — Progress Notes (Signed)
UR completed. Patient changed to inpatient- requiring IVF @ 75cc/hr and IV antibiotics.  

## 2013-08-29 NOTE — Evaluation (Signed)
Physical Therapy Evaluation Patient Details Name: Nathaniel Stone MRN: [id number] DOB: 01/03/1929 Today's Date: 08/29/2013 Time: 1030-1045 PT Time Calculation (min): 15 min  PT Assessment / Plan / Recommendation History of Present Illness  This is a 80 y.o. year old male with prior hx/o severe dementia, agitation, HTN, remote hx/o CAD s/p stent  presenting with worsening confusion, inability to ambulate dehydration. Level V caveat 2/2 dementia. Pt's daughter and son primary historians. Pt is a nursing home resident. Family brought pt to ER 4-5 days ago for L sided hip pain with inability to ambulate. A CT hip was obtained that was negative for fracture. Per the family, there was no reported fall at the nursing home. Family states that pt has been generally lethargic over the past week, which is off of pt's baseline. Pt is non conversive, but does sing at baseline. Pt has not been doing this over past week. Today, family states that pt spiked a fever in the nursing home. Pt has also had ? Decreased appetite. No cough. No recent changes in medication. Pt alsow with generalized pain diffusely.  Pt subsequently brought back to the ER because of fever.   Clinical Impression  Pt presents unable to attempt standing or gait at this time, according to family he was able to do this at baseline.  Pt will benefit from continued PT services to address deficits and increase functional abilities.    PT Assessment  Patient needs continued PT services    Follow Up Recommendations  SNF    Does the patient have the potential to tolerate intense rehabilitation      Barriers to Discharge        Equipment Recommendations  None recommended by PT    Recommendations for Other Services     Frequency Min 2X/week    Precautions / Restrictions Precautions Precautions: Fall Precaution Comments: dementia Restrictions Weight Bearing Restrictions: No   Pertinent Vitals/Pain Pt grabs L LE with mobility, eases with  rest      Mobility  Bed Mobility Overal bed mobility: Needs Assistance Bed Mobility: Supine to Sit;Sit to Supine Supine to sit: Mod assist Sit to supine: Mod assist General bed mobility comments: pt able to assist with increased time and gesturing cues, manual facilitation for initiation Transfers General transfer comment: attempted to have pt stand multiple attempts using different techniques.  pt resistant to standing with HHA, rail, gesturing cues, assist at waist/hips    Exercises     PT Diagnosis: Generalized weakness;Difficulty walking  PT Problem List: Decreased activity tolerance;Decreased mobility;Decreased strength PT Treatment Interventions: DME instruction;Gait training;Functional mobility training;Therapeutic activities;Therapeutic exercise;Balance training;Neuromuscular re-education;Patient/family education;Wheelchair mobility training;Modalities     PT Goals(Current goals can be found in the care plan section) Acute Rehab PT Goals Patient Stated Goal: unable to state PT Goal Formulation: With family Time For Goal Achievement: 09/05/13 Potential to Achieve Goals: Good  Visit Information  Last PT Received On: 08/29/13 Assistance Needed: +1 History of Present Illness: This is a 80 y.o. year old male with prior hx/o severe dementia, agitation, HTN, remote hx/o CAD s/p stent  presenting with worsening confusion, inability to ambulate dehydration. Level V caveat 2/2 dementia. Pt's daughter and son primary historians. Pt is a nursing home resident. Family brought pt to ER 4-5 days ago for L sided hip pain with inability to ambulate. A CT hip was obtained that was negative for fracture. Per the family, there was no reported fall at the nursing home. Family states that pt has  been generally lethargic over the past week, which is off of pt's baseline. Pt is non conversive, but does sing at baseline. Pt has not been doing this over past week. Today, family states that pt spiked a  fever in the nursing home. Pt has also had ? Decreased appetite. No cough. No recent changes in medication. Pt alsow with generalized pain diffusely.  Pt subsequently brought back to the ER because of fever.        Prior Functioning  Home Living Family/patient expects to be discharged to:: Skilled nursing facility Prior Function Level of Independence: Needs assistance Comments: per family pt was able to ambulate without AD with assistance Communication Communication:  (pt singing throughout session, difficulty following gesturing and verbal commands)    Cognition  Cognition Arousal/Alertness: Awake/alert Behavior During Therapy: Restless Overall Cognitive Status: History of cognitive impairments - at baseline (dementia)    Extremity/Trunk Assessment Upper Extremity Assessment Upper Extremity Assessment: Generalized weakness Lower Extremity Assessment Lower Extremity Assessment: Generalized weakness Cervical / Trunk Assessment Cervical / Trunk Assessment: Kyphotic   Balance Balance Overall balance assessment: Needs assistance Standing balance comment: sat EOB x 10 minutes during attempts to stand, pt able to sit with supervision  End of Session PT - End of Session Equipment Utilized During Treatment: Gait belt Activity Tolerance: Other (comment) (pt limited due to dementia) Patient left: in bed;with restraints reapplied;with call bell/phone within reach;with family/visitor present Nurse Communication: Mobility status  GP Functional Assessment Tool Used: clinical judgement Functional Limitation: Mobility: Walking and moving around Mobility: Walking and Moving Around Current Status (U9811): At least 60 percent but less than 80 percent impaired, limited or restricted Mobility: Walking and Moving Around Goal Status [phone]): At least 20 percent but less than 40 percent impaired, limited or restricted   DONAWERTH,KAREN 08/29/2013, 10:45 AM

## 2013-08-29 NOTE — Progress Notes (Signed)
[facility] PROGRESS NOTE  Nathaniel Stone WJX:[id number] DOB: May 06, 1929 DOA: 08/28/2013 PCP: Lemont Fillers., NP  Assessment/Plan: #1 acute encephalopathy Likely secondary to dehydration, and possible infection. Patient with clinical improvement.CT head was negative. CT abdomen and pelvis negative for any acute abnormalities.urinalysis is negative. Check blood cultures x2. MRI of the head is pending. Continue empiric IV vancomycin IV cefepime. Follow.  #2 left hip hematoma Warm compresses. Follow.  #3 coronary artery disease Stable.cardiac enzymes negative x3.  #4 hypernatremia Place on D5W. Follow.  #5 dementia Stable. Continue Depakote and Seroquel.  Code Status: full Family Communication: updated sons at bedside. Disposition Plan: SNF when medically stable.   Consultants:  none  Procedures:  CT head 08/28/2013  CT C-spine 08/28/2013  CT abdomen and pelvis 08/28/2013  Lower extremity doppler 08/29/13  Antibiotics:  IV cefepime 08/28/2013  IV vancomycin 08/28/2013  HPI/Subjective: Patient alert, noncommunicative  Objective: Filed Vitals:   08/29/13 0808  BP: 105/77  Pulse: 82  Temp: 97.2 F (36.2 C)  Resp: 18    Intake/Output Summary (Last 24 hours) at 08/29/13 1137 Last data filed at 08/29/13 0909  Gross per 24 hour  Intake   1000 ml  Output    550 ml  Net    450 ml   Filed Weights   08/28/13 2337  Weight: 55.611 kg (122 lb 9.6 oz)    Exam:   General:  NAD. Alert. noncommunicative.  Cardiovascular: RRR  Respiratory: CTAB  Abdomen: soft, nontender, nondistended, positive bowel sounds.  Musculoskeletal: left hip with hematoma tender to palpation.  Data Reviewed: Basic Metabolic Panel:  Recent Labs Lab 08/24/13 1952 08/28/13 1836 08/29/13 0400  NA 140 154* 154*  K 4.5 3.9 3.7  CL 101 115* 115*  CO2 24 26 27   GLUCOSE 114* 98 84  BUN 31* 33* 25*  CREATININE 1.02 0.79 0.63  CALCIUM 9.7 9.6 9.2   Liver Function  Tests:  Recent Labs Lab 08/28/13 1836 08/29/13 0400  AST 23 20  ALT 14 11  ALKPHOS 55 52  BILITOT 0.7 0.7  PROT 6.8 6.0  ALBUMIN 2.3* 2.0*   No results found for this basename: LIPASE, AMYLASE,  in the last 168 hours No results found for this basename: AMMONIA,  in the last 168 hours CBC:  Recent Labs Lab 08/24/13 1952 08/28/13 1836 08/29/13 0400  WBC 13.0* 13.9* 11.3*  NEUTROABS 10.0* 11.1* 8.9*  HGB 14.5 13.8 12.9*  HCT 42.9 41.4 38.8*  MCV 91.3 92.8 93.5  PLT 240 259 259   Cardiac Enzymes:  Recent Labs Lab 08/28/13 2358 08/29/13 0400  TROPONINI <0.30 <0.30   BNP (last 3 results) No results found for this basename: PROBNP,  in the last 8760 hours CBG: No results found for this basename: GLUCAP,  in the last 168 hours  Recent Results (from the past 240 hour(s))  MRSA PCR SCREENING     Status: None   Collection Time    08/29/13 12:02 AM      Result Value Ref Range Status   MRSA by PCR NEGATIVE  NEGATIVE Final   Comment:            The GeneXpert MRSA Assay (FDA     approved for NASAL specimens     only), is one component of a     comprehensive MRSA colonization     surveillance program. It is not     intended to diagnose MRSA     infection nor to guide or  monitor treatment for     MRSA infections.     Studies: Dg Chest 1 View  08/28/2013   CLINICAL DATA:  Weakness, confusion  EXAM: CHEST - 1 VIEW  COMPARISON:  07/12/2013  FINDINGS: Low lung volumes. Lungs are clear. Heart size and mediastinal contours are within normal limits. No effusion.  Surgical clips right upper abdomen. Visualized skeletal structures are unremarkable.  IMPRESSION: No acute cardiopulmonary disease.   Electronically Signed   By: Oley Balm M.D.   On: 08/28/2013 20:21   Dg Lumbar Spine Complete  08/28/2013   CLINICAL DATA:  Weakness, confusion  EXAM: LUMBAR SPINE - COMPLETE 4+ VIEW  COMPARISON:  CT 08/14/2008  FINDINGS: There is no evidence of lumbar spine fracture. Alignment  is normal. Mild narrowing L4-5 and moderate narrowing L5-S1 interspaces. Patchy aortic calcifications. Surgical clips right upper abdomen.  IMPRESSION: 1. Negative for fracture or other acute bone abnormality. 2. Degenerative disc disease L4-S1.   Electronically Signed   By: Oley Balm M.D.   On: 08/28/2013 20:28   Ct Head Wo Contrast  08/28/2013   CLINICAL DATA:  Generalize decreased an NG over the past 3 days. Pain left leg. Dementia.  EXAM: CT HEAD WITHOUT CONTRAST  CT CERVICAL SPINE WITHOUT CONTRAST  TECHNIQUE: Multidetector CT imaging of the head and cervical spine was performed following the standard protocol without intravenous contrast. Multiplanar CT image reconstructions of the cervical spine were also generated.  COMPARISON:  02/27/2013.  FINDINGS: CT HEAD FINDINGS  Small subcutaneous hematoma right occipital region may be present (series 2 image 13).  No skull fracture or intracranial hemorrhage.  Global atrophy without hydrocephalus.  Small vessel disease type changes. Remote infarct anterior limb right internal capsule. No CT evidence of large acute infarct.  No intracranial mass lesion noted on this unenhanced exam.  CT CERVICAL SPINE FINDINGS  No cervical spine fracture.  Cervical spondylotic changes including transverse ligament hypertrophy. Minimal anterior slip of C5 felt to be related to degenerative changes. No abnormal prevertebral soft tissue swelling. If ligamentous injury or cord injury were of high clinical concern, MR imaging could be obtained for further delineation.  IMPRESSION: CT HEAD :  Small subcutaneous hematoma right occipital region may be present (series 2 image 13).  No skull fracture or intracranial hemorrhage.  Global atrophy without hydrocephalus.  Small vessel disease type changes. Remote infarct anterior limb right internal capsule. No CT evidence of large acute infarct.  CT CERVICAL SPINE :  No cervical spine fracture.  Cervical spondylotic changes including  transverse ligament hypertrophy. Spinal stenosis most notable C3-4 and C5-6.  Minimal anterior slip of C5 felt to be related to degenerative changes.   Electronically Signed   By: Bridgett Larsson M.D.   On: 08/28/2013 19:06   Ct Cervical Spine Wo Contrast  08/28/2013   CLINICAL DATA:  Generalize decreased an NG over the past 3 days. Pain left leg. Dementia.  EXAM: CT HEAD WITHOUT CONTRAST  CT CERVICAL SPINE WITHOUT CONTRAST  TECHNIQUE: Multidetector CT imaging of the head and cervical spine was performed following the standard protocol without intravenous contrast. Multiplanar CT image reconstructions of the cervical spine were also generated.  COMPARISON:  02/27/2013.  FINDINGS: CT HEAD FINDINGS  Small subcutaneous hematoma right occipital region may be present (series 2 image 13).  No skull fracture or intracranial hemorrhage.  Global atrophy without hydrocephalus.  Small vessel disease type changes. Remote infarct anterior limb right internal capsule. No CT evidence of large acute infarct.  No intracranial mass lesion noted on this unenhanced exam.  CT CERVICAL SPINE FINDINGS  No cervical spine fracture.  Cervical spondylotic changes including transverse ligament hypertrophy. Minimal anterior slip of C5 felt to be related to degenerative changes. No abnormal prevertebral soft tissue swelling. If ligamentous injury or cord injury were of high clinical concern, MR imaging could be obtained for further delineation.  IMPRESSION: CT HEAD :  Small subcutaneous hematoma right occipital region may be present (series 2 image 13).  No skull fracture or intracranial hemorrhage.  Global atrophy without hydrocephalus.  Small vessel disease type changes. Remote infarct anterior limb right internal capsule. No CT evidence of large acute infarct.  CT CERVICAL SPINE :  No cervical spine fracture.  Cervical spondylotic changes including transverse ligament hypertrophy. Spinal stenosis most notable C3-4 and C5-6.  Minimal anterior  slip of C5 felt to be related to degenerative changes.   Electronically Signed   By: Bridgett LarssonSteve  Olson M.D.   On: 08/28/2013 19:06   Ct Abdomen Pelvis W Contrast  08/28/2013   CLINICAL DATA:  Severe dementia, abdominal pain, assess for diverticulitis.  EXAM: CT ABDOMEN AND PELVIS WITH CONTRAST  TECHNIQUE: Multidetector CT imaging of the abdomen and pelvis was performed using the standard protocol following bolus administration of intravenous contrast.  CONTRAST:  80mL OMNIPAQUE IOHEXOL 300 MG/ML  SOLN  COMPARISON:  CT PELVIS W/O CM dated 08/24/2013  FINDINGS: Limited view of the lung bases demonstrates dependent atelectasis and are otherwise clear though, moderately motion degraded.  Mild to moderate intrahepatic biliary dilatation, status post cholecystectomy. The liver is otherwise unremarkable. Spleen, pancreas, adrenal glands are unremarkable.  Fluid filled nondistended small bowel. Stomach, small and large bowel are overall normal in course and caliber without inflammatory changes the sensitivity may be decreased by lack of enteric contrast and moderate patient motion. Moderate amount of stool at rectal vault. No definite diverticulosis nor diverticulitis. No intraperitoneal free fluid nor free air.  Kidneys are well located, symmetric enhancement without nephrolithiasis, hydronephrosis. 16 mm simple cyst in the interpolar region of left kidney. No definite solid masses though mild motion through this level may decrease sensitivity. Ectasia of the infrarenal aorta without aneurysm, moderate calcific atherosclerosis of the aortoiliac vessels. Prostate is not enlarged.  Moderate left fat containing inguinal hernia. Left flank/hip subcutaneous and ipsilateral gluteal muscle edema without focal fluid collection. Severe L5-S1 degenerative disc disease resulting in moderate to severe neural foraminal narrowing.  IMPRESSION: Moderate amount of stool at rectal vault without bowel obstruction or CT findings of  diverticulitis.  Left flank/ is hip subcutaneous and edematous appearing left gluteal muscles, recommend direct inspection.   Electronically Signed   By: Awilda Metroourtnay  Bloomer   On: 08/28/2013 23:28   Dg Knee Complete 4 Views Left  08/28/2013   CLINICAL DATA:  Weakness, confusion  EXAM: LEFT KNEE - COMPLETE 4+ VIEW  COMPARISON:  None.  FINDINGS: Narrowing of the articular cartilage in the medial compartment. Chondrocalcinosis in medial and lateral compartments. Small marginal spurs from the medial femoral condyles and tibial plateau as well as patellar articular surface. Negative for fracture or dislocation. Diffuse osteopenia. No effusion.  IMPRESSION: 1. Negative for fracture or other acute bone abnormality. 2. Tricompartmental degenerative changes with chondrocalcinosis, suggesting CPPD.   Electronically Signed   By: Oley Balmaniel  Hassell M.D.   On: 08/28/2013 20:27    Scheduled Meds: . beta carotene w/minerals  1 tablet Oral Daily  . ceFEPime (MAXIPIME) IV  1 g Intravenous Q12H  . cyanocobalamin  500 mcg Oral Daily  . divalproex  375 mg Oral TID  . latanoprost  1 drop Both Eyes QHS  . LORazepam  0.25 mg Intravenous Once  . polyethylene glycol  17 g Oral BID  . QUEtiapine  50 mg Oral BID  . sodium chloride  3 mL Intravenous Q12H  . vancomycin  750 mg Intravenous Q12H  . [START ON 09/03/2013] Vitamin D (Ergocalciferol)  50,000 Units Oral Q Mon   Continuous Infusions: . dextrose 5 % 1,000 mL infusion 75 mL/hr at 08/29/13 0815    Active Problems:   Encephalopathy    Time spent: 35 MINS    [facility] MD [facility] Pager [phone]. If 7PM-7AM, please contact night-coverage at [email or web address], password [facility] 08/29/2013, 11:37 AM  LOS: 1 day

## 2013-08-29 NOTE — Evaluation (Signed)
Clinical/Bedside Swallow Evaluation Patient Details  Name: Nathaniel Stone MRN: [id number] Date of Birth: [birth year]  Today's Date: 08/29/2013 Time:  -     Past Medical History:  Past Medical History  Diagnosis Date  . GERD (gastroesophageal reflux disease)   . Hypertension   . History of GI bleed     multiple distal duodenal and general diverticula  . Dementia   . Glaucoma   . CAD (coronary artery disease)   . Hyperlipidemia   . Alzheimer disease    Past Surgical History:  Past Surgical History  Procedure Laterality Date  . Cholecystectomy    . Back surgery     HPI:  This is a 80 y.o. year old male with prior hx/o severe dementia, agitation, HTN, remote hx/o CAD s/p stent  presenting with worsening confusion, inability to ambulate dehydration. Level V caveat 2/2 dementia. Pt's daughter and son primary historians. Pt is a nursing home resident. Family brought pt to ER 4-5 days ago for L sided hip pain with inability to ambulate. A CT hip was obtained that was negative for fracture. Per the family, there was no reported fall at the nursing home. Family states that pt has been generally lethargic over the past week, which is off of pt's baseline. Pt is non conversive, but does sing at baseline. Pt has not been doing this over past week. Today, family states that pt spiked a fever in the nursing home. Pt has also had ? Decreased appetite. No cough. No recent changes in medication. Pt alsow with generalized pain diffusely.  Pt subsequently brought back to the ER because of fever.    Assessment / Plan / Recommendation Clinical Impression  Patient singing pleasantly; does not f/c's; does not respond to questions, but does accept food and liquid when presented to lips.  Patient coughed after drink of water (coughed up mucous and spat it out on bed).  Pt. was unable to chew cracker, and spat it out after a minute of attempting to chew.  Pt. was able to swallow nectar thick liquids and purees  without overt s/s of aspiration.      Aspiration Risk  Moderate    Diet Recommendation Dysphagia 1 (Puree);Nectar-thick liquid   Liquid Administration via: No straw;Cup Medication Administration: Crushed with puree Supervision: Staff to assist with self feeding Compensations: Slow rate;Small sips/bites;Check for pocketing Postural Changes and/or Swallow Maneuvers: Seated upright 90 degrees    Other  Recommendations Oral Care Recommendations: Oral care BID;Staff/trained caregiver to provide oral care Other Recommendations: Order thickener from pharmacy;Prohibited food (jello, ice cream, thin soups);Have oral suction available;Clarify dietary restrictions   Follow Up Recommendations  Skilled Nursing facility    Frequency and Duration min 2x/week  2 weeks   Pertinent Vitals/Pain CXR: NAD; afebrile currently; LS clear/diminished    SLP Swallow Goals   See care plan  Swallow Study Prior Functional Status       General HPI: This is a 80 y.o. year old male with prior hx/o severe dementia, agitation, HTN, remote hx/o CAD s/p stent  presenting with worsening confusion, inability to ambulate dehydration. Level V caveat 2/2 dementia. Pt's daughter and son primary historians. Pt is a nursing home resident. Family brought pt to ER 4-5 days ago for L sided hip pain with inability to ambulate. A CT hip was obtained that was negative for fracture. Per the family, there was no reported fall at the nursing home. Family states that pt has been generally lethargic over the past  week, which is off of pt's baseline. Pt is non conversive, but does sing at baseline. Pt has not been doing this over past week. Today, family states that pt spiked a fever in the nursing home. Pt has also had ? Decreased appetite. No cough. No recent changes in medication. Pt alsow with generalized pain diffusely.  Pt subsequently brought back to the ER because of fever.  Type of Study: Bedside swallow evaluation Previous  Swallow Assessment: none  Diet Prior to this Study: NPO Temperature Spikes Noted: No Respiratory Status: Room air History of Recent Intubation: No Behavior/Cognition: Alert;Pleasant mood;Confused;Distractible;Requires cueing;Doesn't follow directions;Decreased sustained attention Oral Cavity - Dentition: Edentulous Self-Feeding Abilities: Total assist Patient Positioning: Upright in bed Baseline Vocal Quality: Clear Volitional Cough: Cognitively unable to elicit Volitional Swallow: Unable to elicit    Oral/Motor/Sensory Function Overall Oral Motor/Sensory Function: Appears within functional limits for tasks assessed   Ice Chips     Thin Liquid Thin Liquid: Impaired Presentation: Cup;Straw Pharyngeal  Phase Impairments: Multiple swallows;Suspected delayed Swallow;Decreased hyoid-laryngeal movement;Wet Vocal Quality;Cough - Immediate    Nectar Thick Nectar Thick Liquid: Within functional limits Presentation: Cup   Honey Thick Honey Thick Liquid: Not tested   Puree Puree: Within functional limits   Solid   GO    Solid: Impaired Oral Phase Impairments: Impaired mastication;Impaired anterior to posterior transit;Poor awareness of bolus;Reduced lingual movement/coordination (spit out cracker) Oral Phase Functional Implications:  (spat out cracker)       Maryjo RochesterWillis, Zeus Marquis T 08/29/2013,3:34 PM

## 2013-08-29 NOTE — Progress Notes (Signed)
SLP Cancellation Note  Patient Details Name: Nathaniel Stone MRN: [id number] DOB: 04/05/1929   Cancelled treatment:        Attempted to see pt. For swallow evaluation.  Currently gone for MRI.  SLP will return.   Maryjo RochesterWillis, Clairessa Boulet T 08/29/2013, 11:35 AM

## 2013-08-29 NOTE — Progress Notes (Signed)
VASCULAR LAB PRELIMINARY  PRELIMINARY  PRELIMINARY  PRELIMINARY  Bilateral lower extremity venous duplex  completed.    Preliminary report:  Bilateral:  No evidence of DVT, superficial thrombosis, or Baker's Cyst.    Lametria Klunk, RVT 08/29/2013, 2:47 PM

## 2013-08-30 ENCOUNTER — Inpatient Hospital Stay (HOSPITAL_COMMUNITY): Payer: Medicare Other

## 2013-08-30 DIAGNOSIS — I10 Essential (primary) hypertension: Secondary | ICD-10-CM

## 2013-08-30 LAB — CBC WITH DIFFERENTIAL/PLATELET
BASOS ABS: 0 10*3/uL (ref 0.0–0.1)
BASOS PCT: 0 % (ref 0–1)
Eosinophils Absolute: 0.2 10*3/uL (ref 0.0–0.7)
Eosinophils Relative: 2 % (ref 0–5)
HCT: 38.1 % — ABNORMAL LOW (ref 39.0–52.0)
HEMOGLOBIN: 12.7 g/dL — AB (ref 13.0–17.0)
Lymphocytes Relative: 13 % (ref 12–46)
Lymphs Abs: 1.5 10*3/uL (ref 0.7–4.0)
MCH: 31.1 pg (ref 26.0–34.0)
MCHC: 33.3 g/dL (ref 30.0–36.0)
MCV: 93.2 fL (ref 78.0–100.0)
MONOS PCT: 12 % (ref 3–12)
Monocytes Absolute: 1.3 10*3/uL — ABNORMAL HIGH (ref 0.1–1.0)
NEUTROS PCT: 73 % (ref 43–77)
Neutro Abs: 8.4 10*3/uL — ABNORMAL HIGH (ref 1.7–7.7)
Platelets: 243 10*3/uL (ref 150–400)
RBC: 4.09 MIL/uL — ABNORMAL LOW (ref 4.22–5.81)
RDW: 13.5 % (ref 11.5–15.5)
WBC: 11.4 10*3/uL — ABNORMAL HIGH (ref 4.0–10.5)

## 2013-08-30 LAB — COMPREHENSIVE METABOLIC PANEL
ALT: 10 U/L (ref 0–53)
AST: 16 U/L (ref 0–37)
Albumin: 1.9 g/dL — ABNORMAL LOW (ref 3.5–5.2)
Alkaline Phosphatase: 52 U/L (ref 39–117)
BUN: 21 mg/dL (ref 6–23)
CALCIUM: 9.3 mg/dL (ref 8.4–10.5)
CO2: 27 mEq/L (ref 19–32)
Chloride: 113 mEq/L — ABNORMAL HIGH (ref 96–112)
Creatinine, Ser: 0.76 mg/dL (ref 0.50–1.35)
GFR calc non Af Amer: 83 mL/min — ABNORMAL LOW (ref >90)
GLUCOSE: 99 mg/dL (ref 70–99)
Potassium: 4.2 mEq/L (ref 3.7–5.3)
Sodium: 150 mEq/L — ABNORMAL HIGH (ref 137–147)
Total Bilirubin: 0.3 mg/dL (ref 0.3–1.2)
Total Protein: 5.9 g/dL — ABNORMAL LOW (ref 6.0–8.3)

## 2013-08-30 NOTE — Progress Notes (Addendum)
Clinical Social Work Department CLINICAL SOCIAL WORK PLACEMENT NOTE 08/30/2013  Patient:  Nathaniel Stone,Nathaniel Stone  Account Number:  [id number] Admit date:  08/29/2013  Clinical Social Worker:  Sharol HarnessPOONUM Dio Giller, Theresia MajorsLCSWA  Date/time:  08/30/2013 12:00 M  Clinical Social Work is seeking post-discharge placement for this patient at the following level of care:   SKILLED NURSING   (*CSW will update this form in Epic as items are completed)   08/30/2013  [facility]family provided with [facility] of Clinical [facility] Work's list of facilities offering this level of care within the geographic area requested by the patient (or if unable, by the patient's family).  08/30/2013  Patient/family informed of their freedom to choose among providers that offer the needed level of care, that participate in Medicare, Medicaid or managed care program needed by the patient, have an available bed and are willing to accept the patient.  08/30/2013  Patient/family informed of [facility]' ownership interest in [facility], as well as of the fact that they are under no obligation to receive care at this facility.  PASARR submitted to EDS on  PASARR number received from EDS on   FL2 transmitted to all facilities in geographic area requested by pt/family on  08/30/2013 FL2 transmitted to all facilities within larger geographic area on   Patient informed that his/her managed care company has contracts with or will negotiate with  certain facilities, including the following:     Patient/family informed of bed offers received:  08/30/2013 [facility] Physician recommends and patient chooses bed at    Patient to be transferred to  on   Patient to be transferred to facility by   The following physician request were entered in Epic:   Additional Comments:   Ezra Marquess, LCSWA [phone]

## 2013-08-30 NOTE — Progress Notes (Signed)
OT Cancellation Note  Patient Details Name: Nathaniel Stone MRN: [id number] DOB: 09/29/1928   Cancelled Treatment:    Reason Eval/Treat Not Completed: OT screened, no needs identified, will sign off. Pt not appropriate for acute OT services at this time, defer any further OT interventions to SNF  Galen ManilaSpencer, Daizy Outen Jeanette, OT 08/30/2013, 11:45 AM

## 2013-08-30 NOTE — Progress Notes (Signed)
[facility] PROGRESS NOTE  Nathaniel Stone ZOX:[id number] DOB: Aug 08, 1928 DOA: 08/28/2013 PCP: Lemont Fillers., NP  Assessment/Plan:  #1 acute encephalopathy Likely secondary to dehydration, and possible infection. Patient with clinical improvement.CT head was negative. CT abdomen and pelvis negative for any acute abnormalities.urinalysis is negative. Check blood cultures x2. MRI of the head is pending. Continue empiric IV vancomycin IV cefepime. Follow.  #2 left hip hematoma Warm compresses. Follow.  #3 Coronary artery disease Stable.cardiac enzymes negative x3.  #4 Hypernatremia Placed on D5W.Improving, sodium is down to 150.  #5 Dementia Stable. Continue Depakote and Seroquel.  Code Status: full Family Communication: No family at bedside Disposition Plan: SNF when medically stable.   Consultants:  none  Procedures:  CT head 08/28/2013  CT C-spine 08/28/2013  CT abdomen and pelvis 08/28/2013  Lower extremity doppler 08/29/13  Antibiotics:  IV cefepime 08/28/2013  IV vancomycin 08/28/2013  HPI/Subjective: Patient alert, singing songs.   Objective: Filed Vitals:   08/30/13 0407  BP: 129/93  Pulse: 103  Temp: 98.5 F (36.9 C)  Resp: 18    Intake/Output Summary (Last 24 hours) at 08/30/13 1247 Last data filed at 08/30/13 0800  Gross per 24 hour  Intake   1038 ml  Output    500 ml  Net    538 ml   Filed Weights   08/28/13 2337  Weight: 55.611 kg (122 lb 9.6 oz)    Exam:  Physical Exam: Head: Normocephalic, atraumatic.  Eyes: No signs of jaundice, EOMI Nose: Mucous membranes dry.  Throat: Oropharynx nonerythematous, no exudate appreciated.  Neck: supple,No deformities, masses, or tenderness noted. Lungs: Normal respiratory effort. B/L Clear to auscultation, no crackles or wheezes.  Heart: Regular RR. S1 and S2 normal  Abdomen: BS normoactive. Soft, Nondistended, non-tender.  Extremities: left hip hematoma, tender to  palpation   Data Reviewed: Basic Metabolic Panel:  Recent Labs Lab 08/24/13 1952 08/28/13 1836 08/29/13 0400 08/30/13 0359  NA 140 154* 154* 150*  K 4.5 3.9 3.7 4.2  CL 101 115* 115* 113*  CO2 24 26 27 27   GLUCOSE 114* 98 84 99  BUN 31* 33* 25* 21  CREATININE 1.02 0.79 0.63 0.76  CALCIUM 9.7 9.6 9.2 9.3   Liver Function Tests:  Recent Labs Lab 08/28/13 1836 08/29/13 0400 08/30/13 0359  AST 23 20 16   ALT 14 11 10   ALKPHOS 55 52 52  BILITOT 0.7 0.7 0.3  PROT 6.8 6.0 5.9*  ALBUMIN 2.3* 2.0* 1.9*   No results found for this basename: LIPASE, AMYLASE,  in the last 168 hours No results found for this basename: AMMONIA,  in the last 168 hours CBC:  Recent Labs Lab 08/24/13 1952 08/28/13 1836 08/29/13 0400 08/30/13 0359  WBC 13.0* 13.9* 11.3* 11.4*  NEUTROABS 10.0* 11.1* 8.9* 8.4*  HGB 14.5 13.8 12.9* 12.7*  HCT 42.9 41.4 38.8* 38.1*  MCV 91.3 92.8 93.5 93.2  PLT 240 259 259 243   Cardiac Enzymes:  Recent Labs Lab 08/28/13 2358 08/29/13 0400 08/29/13 1005  TROPONINI <0.30 <0.30 <0.30   BNP (last 3 results) No results found for this basename: PROBNP,  in the last 8760 hours CBG: No results found for this basename: GLUCAP,  in the last 168 hours  Recent Results (from the past 240 hour(s))  URINE CULTURE     Status: None   Collection Time    08/28/13  6:39 PM      Result Value Ref Range Status   Specimen Description URINE, CATHETERIZED  Final   Special Requests NONE   Final   Culture  Setup Time     Final   Value: 08/28/2013 23:37     Performed at [facility]   Colony Count     Final   Value: NO GROWTH     Performed at [facility]   Culture     Final   Value: NO GROWTH     Performed at [facility]   Report Status 08/29/2013 FINAL   Final  MRSA PCR SCREENING     Status: None   Collection Time    08/29/13 12:02 AM      Result Value Ref Range Status   MRSA by PCR NEGATIVE  NEGATIVE Final   Comment:            The  GeneXpert MRSA Assay (FDA     approved for NASAL specimens     only), is one component of a     comprehensive MRSA colonization     surveillance program. It is not     intended to diagnose MRSA     infection nor to guide or     monitor treatment for     MRSA infections.  CULTURE, BLOOD (ROUTINE X 2)     Status: None   Collection Time    08/29/13  1:10 PM      Result Value Ref Range Status   Specimen Description BLOOD LEFT ARM   Final   Special Requests BOTTLES DRAWN AEROBIC AND ANAEROBIC 10CC   Final   Culture  Setup Time     Final   Value: 08/29/2013 16:37     Performed at [facility]   Culture     Final   Value:        BLOOD CULTURE RECEIVED NO GROWTH TO DATE CULTURE WILL BE HELD FOR 5 DAYS BEFORE ISSUING A FINAL NEGATIVE REPORT     Performed at [facility]   Report Status PENDING   Incomplete  CULTURE, BLOOD (ROUTINE X 2)     Status: None   Collection Time    08/29/13  1:15 PM      Result Value Ref Range Status   Specimen Description BLOOD LEFT HAND   Final   Special Requests BOTTLES DRAWN AEROBIC ONLY 10CC   Final   Culture  Setup Time     Final   Value: 08/29/2013 16:37     Performed at [facility]   Culture     Final   Value:        BLOOD CULTURE RECEIVED NO GROWTH TO DATE CULTURE WILL BE HELD FOR 5 DAYS BEFORE ISSUING A FINAL NEGATIVE REPORT     Performed at [facility]   Report Status PENDING   Incomplete     Studies: Dg Chest 1 View  08/28/2013   CLINICAL DATA:  Weakness, confusion  EXAM: CHEST - 1 VIEW  COMPARISON:  07/12/2013  FINDINGS: Low lung volumes. Lungs are clear. Heart size and mediastinal contours are within normal limits. No effusion.  Surgical clips right upper abdomen. Visualized skeletal structures are unremarkable.  IMPRESSION: No acute cardiopulmonary disease.   Electronically Signed   By: Oley Balmaniel  Hassell M.D.   On: 08/28/2013 20:21   Dg Lumbar Spine Complete  08/28/2013   CLINICAL DATA:  Weakness, confusion  EXAM:  LUMBAR SPINE - COMPLETE 4+ VIEW  COMPARISON:  CT 08/14/2008  FINDINGS: There is no evidence of lumbar spine  fracture. Alignment is normal. Mild narrowing L4-5 and moderate narrowing L5-S1 interspaces. Patchy aortic calcifications. Surgical clips right upper abdomen.  IMPRESSION: 1. Negative for fracture or other acute bone abnormality. 2. Degenerative disc disease L4-S1.   Electronically Signed   By: Oley Balm M.D.   On: 08/28/2013 20:28   Ct Head Wo Contrast  08/28/2013   CLINICAL DATA:  Generalize decreased an NG over the past 3 days. Pain left leg. Dementia.  EXAM: CT HEAD WITHOUT CONTRAST  CT CERVICAL SPINE WITHOUT CONTRAST  TECHNIQUE: Multidetector CT imaging of the head and cervical spine was performed following the standard protocol without intravenous contrast. Multiplanar CT image reconstructions of the cervical spine were also generated.  COMPARISON:  02/27/2013.  FINDINGS: CT HEAD FINDINGS  Small subcutaneous hematoma right occipital region may be present (series 2 image 13).  No skull fracture or intracranial hemorrhage.  Global atrophy without hydrocephalus.  Small vessel disease type changes. Remote infarct anterior limb right internal capsule. No CT evidence of large acute infarct.  No intracranial mass lesion noted on this unenhanced exam.  CT CERVICAL SPINE FINDINGS  No cervical spine fracture.  Cervical spondylotic changes including transverse ligament hypertrophy. Minimal anterior slip of C5 felt to be related to degenerative changes. No abnormal prevertebral soft tissue swelling. If ligamentous injury or cord injury were of high clinical concern, MR imaging could be obtained for further delineation.  IMPRESSION: CT HEAD :  Small subcutaneous hematoma right occipital region may be present (series 2 image 13).  No skull fracture or intracranial hemorrhage.  Global atrophy without hydrocephalus.  Small vessel disease type changes. Remote infarct anterior limb right internal capsule. No CT  evidence of large acute infarct.  CT CERVICAL SPINE :  No cervical spine fracture.  Cervical spondylotic changes including transverse ligament hypertrophy. Spinal stenosis most notable C3-4 and C5-6.  Minimal anterior slip of C5 felt to be related to degenerative changes.   Electronically Signed   By: Bridgett Larsson M.D.   On: 08/28/2013 19:06   Ct Cervical Spine Wo Contrast  08/28/2013   CLINICAL DATA:  Generalize decreased an NG over the past 3 days. Pain left leg. Dementia.  EXAM: CT HEAD WITHOUT CONTRAST  CT CERVICAL SPINE WITHOUT CONTRAST  TECHNIQUE: Multidetector CT imaging of the head and cervical spine was performed following the standard protocol without intravenous contrast. Multiplanar CT image reconstructions of the cervical spine were also generated.  COMPARISON:  02/27/2013.  FINDINGS: CT HEAD FINDINGS  Small subcutaneous hematoma right occipital region may be present (series 2 image 13).  No skull fracture or intracranial hemorrhage.  Global atrophy without hydrocephalus.  Small vessel disease type changes. Remote infarct anterior limb right internal capsule. No CT evidence of large acute infarct.  No intracranial mass lesion noted on this unenhanced exam.  CT CERVICAL SPINE FINDINGS  No cervical spine fracture.  Cervical spondylotic changes including transverse ligament hypertrophy. Minimal anterior slip of C5 felt to be related to degenerative changes. No abnormal prevertebral soft tissue swelling. If ligamentous injury or cord injury were of high clinical concern, MR imaging could be obtained for further delineation.  IMPRESSION: CT HEAD :  Small subcutaneous hematoma right occipital region may be present (series 2 image 13).  No skull fracture or intracranial hemorrhage.  Global atrophy without hydrocephalus.  Small vessel disease type changes. Remote infarct anterior limb right internal capsule. No CT evidence of large acute infarct.  CT CERVICAL SPINE :  No cervical spine fracture.  Cervical  spondylotic changes including transverse ligament hypertrophy. Spinal stenosis most notable C3-4 and C5-6.  Minimal anterior slip of C5 felt to be related to degenerative changes.   Electronically Signed   By: Bridgett Larsson M.D.   On: 08/28/2013 19:06   Ct Abdomen Pelvis W Contrast  08/28/2013   CLINICAL DATA:  Severe dementia, abdominal pain, assess for diverticulitis.  EXAM: CT ABDOMEN AND PELVIS WITH CONTRAST  TECHNIQUE: Multidetector CT imaging of the abdomen and pelvis was performed using the standard protocol following bolus administration of intravenous contrast.  CONTRAST:  80mL OMNIPAQUE IOHEXOL 300 MG/ML  SOLN  COMPARISON:  CT PELVIS W/O CM dated 08/24/2013  FINDINGS: Limited view of the lung bases demonstrates dependent atelectasis and are otherwise clear though, moderately motion degraded.  Mild to moderate intrahepatic biliary dilatation, status post cholecystectomy. The liver is otherwise unremarkable. Spleen, pancreas, adrenal glands are unremarkable.  Fluid filled nondistended small bowel. Stomach, small and large bowel are overall normal in course and caliber without inflammatory changes the sensitivity may be decreased by lack of enteric contrast and moderate patient motion. Moderate amount of stool at rectal vault. No definite diverticulosis nor diverticulitis. No intraperitoneal free fluid nor free air.  Kidneys are well located, symmetric enhancement without nephrolithiasis, hydronephrosis. 16 mm simple cyst in the interpolar region of left kidney. No definite solid masses though mild motion through this level may decrease sensitivity. Ectasia of the infrarenal aorta without aneurysm, moderate calcific atherosclerosis of the aortoiliac vessels. Prostate is not enlarged.  Moderate left fat containing inguinal hernia. Left flank/hip subcutaneous and ipsilateral gluteal muscle edema without focal fluid collection. Severe L5-S1 degenerative disc disease resulting in moderate to severe neural  foraminal narrowing.  IMPRESSION: Moderate amount of stool at rectal vault without bowel obstruction or CT findings of diverticulitis.  Left flank/ is hip subcutaneous and edematous appearing left gluteal muscles, recommend direct inspection.   Electronically Signed   By: Awilda Metro   On: 08/28/2013 23:28   Dg Knee Complete 4 Views Left  08/28/2013   CLINICAL DATA:  Weakness, confusion  EXAM: LEFT KNEE - COMPLETE 4+ VIEW  COMPARISON:  None.  FINDINGS: Narrowing of the articular cartilage in the medial compartment. Chondrocalcinosis in medial and lateral compartments. Small marginal spurs from the medial femoral condyles and tibial plateau as well as patellar articular surface. Negative for fracture or dislocation. Diffuse osteopenia. No effusion.  IMPRESSION: 1. Negative for fracture or other acute bone abnormality. 2. Tricompartmental degenerative changes with chondrocalcinosis, suggesting CPPD.   Electronically Signed   By: Oley Balm M.D.   On: 08/28/2013 20:27    Scheduled Meds: . beta carotene w/minerals  1 tablet Oral Daily  . ceFEPime (MAXIPIME) IV  1 g Intravenous Q12H  . cyanocobalamin  500 mcg Oral Daily  . divalproex  375 mg Oral TID  . latanoprost  1 drop Both Eyes QHS  . polyethylene glycol  17 g Oral BID  . QUEtiapine  50 mg Oral BID  . sodium chloride  3 mL Intravenous Q12H  . vancomycin  750 mg Intravenous Q12H  . [START ON 09/03/2013] Vitamin D (Ergocalciferol)  50,000 Units Oral Q Mon   Continuous Infusions: . dextrose 5 % 1,000 mL infusion 75 mL/hr at 08/29/13 0815    Active Problems:   Encephalopathy    Time spent: 35 MINS    [facility] S MD [facility] Pager [phone]. If 7PM-7AM, please contact night-coverage at [email or web address], password [facility] 08/30/2013, 12:47 PM  LOS: 2 days

## 2013-08-31 LAB — CBC WITH DIFFERENTIAL/PLATELET
BASOS ABS: 0 10*3/uL (ref 0.0–0.1)
Basophils Relative: 0 % (ref 0–1)
EOS ABS: 0.1 10*3/uL (ref 0.0–0.7)
EOS PCT: 1 % (ref 0–5)
HCT: 38.5 % — ABNORMAL LOW (ref 39.0–52.0)
Hemoglobin: 12.8 g/dL — ABNORMAL LOW (ref 13.0–17.0)
Lymphocytes Relative: 11 % — ABNORMAL LOW (ref 12–46)
Lymphs Abs: 1.4 10*3/uL (ref 0.7–4.0)
MCH: 30.7 pg (ref 26.0–34.0)
MCHC: 33.2 g/dL (ref 30.0–36.0)
MCV: 92.3 fL (ref 78.0–100.0)
Monocytes Absolute: 1.2 10*3/uL — ABNORMAL HIGH (ref 0.1–1.0)
Monocytes Relative: 10 % (ref 3–12)
Neutro Abs: 9.9 10*3/uL — ABNORMAL HIGH (ref 1.7–7.7)
Neutrophils Relative %: 79 % — ABNORMAL HIGH (ref 43–77)
Platelets: 279 10*3/uL (ref 150–400)
RBC: 4.17 MIL/uL — ABNORMAL LOW (ref 4.22–5.81)
RDW: 13.4 % (ref 11.5–15.5)
WBC: 12.6 10*3/uL — ABNORMAL HIGH (ref 4.0–10.5)

## 2013-08-31 LAB — COMPREHENSIVE METABOLIC PANEL
ALBUMIN: 1.8 g/dL — AB (ref 3.5–5.2)
ALK PHOS: 50 U/L (ref 39–117)
ALT: 13 U/L (ref 0–53)
AST: 36 U/L (ref 0–37)
BUN: 15 mg/dL (ref 6–23)
CHLORIDE: 106 meq/L (ref 96–112)
CO2: 26 mEq/L (ref 19–32)
Calcium: 9.2 mg/dL (ref 8.4–10.5)
Creatinine, Ser: 0.71 mg/dL (ref 0.50–1.35)
GFR calc Af Amer: >90 mL/min (ref >90)
GFR calc non Af Amer: 85 mL/min — ABNORMAL LOW (ref >90)
Glucose, Bld: 112 mg/dL — ABNORMAL HIGH (ref 70–99)
POTASSIUM: 4.4 meq/L (ref 3.7–5.3)
Sodium: 143 mEq/L (ref 137–147)
Total Bilirubin: 0.3 mg/dL (ref 0.3–1.2)
Total Protein: 6.1 g/dL (ref 6.0–8.3)

## 2013-08-31 MED ORDER — POLYETHYLENE GLYCOL 3350 17 G PO PACK: 17.0000 g | PACK | Freq: Every day | ORAL | Status: AC

## 2013-08-31 MED ORDER — DIVALPROEX SODIUM 125 MG PO CPSP: 375.0000 mg | ORAL_CAPSULE | Freq: Three times a day (TID) | ORAL | Status: AC

## 2013-08-31 MED ORDER — LEVOFLOXACIN 750 MG PO TABS
750.0000 mg | ORAL_TABLET | Freq: Every day | ORAL | Status: DC
  Administered 2013-08-31 – 2013-09-01 (×2): 750 mg via ORAL
  Filled 2013-08-31 (×2): qty 1

## 2013-08-31 MED ORDER — RESOURCE THICKENUP CLEAR PO POWD: ORAL | Status: AC

## 2013-08-31 MED ORDER — QUETIAPINE FUMARATE 50 MG PO TABS: 50.0000 mg | ORAL_TABLET | Freq: Two times a day (BID) | ORAL | Status: AC

## 2013-08-31 MED ORDER — LEVOFLOXACIN 750 MG PO TABS
750.0000 mg | ORAL_TABLET | Freq: Every day | ORAL | Status: AC
Start: 2013-08-31 — End: 2013-09-03

## 2013-08-31 NOTE — Progress Notes (Signed)
[facility] PROGRESS NOTE  Nathaniel Stone:[id number] DOB: [birth year] DOA: 08/28/2013 PCP: Lemont Fillers., NP  Assessment/Plan:  #1 acute encephalopathy As per RN patient's son was in the room today and as per him patient is back to baseline. Encephalopathy was likely due to dehydration, which MG IV in divided superior Levaquin  Patient with clinical improvement.CT head was negative. CT abdomen and pelvis negative for any acute abnormalities.urinalysis is negative. Check blood cultures x2. MRI of the head could not performed as patient was unable to lie still.  #2 left hip hematoma Warm compresses. Follow.  #3 Coronary artery disease Stable.cardiac enzymes negative x3.  #4 Hypernatremia Improved, sodium was 143 today, he has good by mouth intake. Will change the IV fluids to [facility].  #5 Dementia Stable. Continue Depakote and Seroquel.  Code Status: full Family Communication: No family at bedside Disposition Plan: SNF , possible discharge in next 1-2 days if stable   Consultants:  none  Procedures:  CT head 08/28/2013  CT C-spine 08/28/2013  CT abdomen and pelvis 08/28/2013  Lower extremity doppler 08/29/13  Antibiotics:  IV cefepime 08/28/2013-08/31/13  IV vancomycin 08/28/2013- 08/31/13  levaquin  3/6  HPI/Subjective: Patient alert, pleasantly confused.  Objective: Filed Vitals:   08/31/13 1149  BP: 113/79  Pulse: 92  Temp: 98.1 F (36.7 C)  Resp: 20    Intake/Output Summary (Last 24 hours) at 08/31/13 1446 Last data filed at 08/31/13 1332  Gross per 24 hour  Intake    360 ml  Output   1601 ml  Net  -1241 ml   Filed Weights   08/28/13 2337  Weight: 55.611 kg (122 lb 9.6 oz)    Exam:  Physical Exam: Head: Normocephalic, atraumatic.  Eyes: No signs of jaundice, EOMI Nose: Mucous membranes dry.  Throat: Oropharynx nonerythematous, no exudate appreciated.  Neck: supple,No deformities, masses, or tenderness noted. Lungs: Normal  respiratory effort. B/L Clear to auscultation, no crackles or wheezes.  Heart: Regular RR. S1 and S2 normal  Abdomen: BS normoactive. Soft, Nondistended, non-tender.  Extremities: left hip hematoma, tender to palpation   Data Reviewed: Basic Metabolic Panel:  Recent Labs Lab 08/24/13 1952 08/28/13 1836 08/29/13 0400 08/30/13 0359 08/31/13 0440  NA 140 154* 154* 150* 143  K 4.5 3.9 3.7 4.2 4.4  CL 101 115* 115* 113* 106  CO2 24 26 27 27 26   GLUCOSE 114* 98 84 99 112*  BUN 31* 33* 25* 21 15  CREATININE 1.02 0.79 0.63 0.76 0.71  CALCIUM 9.7 9.6 9.2 9.3 9.2   Liver Function Tests:  Recent Labs Lab 08/28/13 1836 08/29/13 0400 08/30/13 0359 08/31/13 0440  AST 23 20 16  36  ALT 14 11 10 13   ALKPHOS 55 52 52 50  BILITOT 0.7 0.7 0.3 0.3  PROT 6.8 6.0 5.9* 6.1  ALBUMIN 2.3* 2.0* 1.9* 1.8*   No results found for this basename: LIPASE, AMYLASE,  in the last 168 hours No results found for this basename: AMMONIA,  in the last 168 hours CBC:  Recent Labs Lab 08/24/13 1952 08/28/13 1836 08/29/13 0400 08/30/13 0359 08/31/13 0440  WBC 13.0* 13.9* 11.3* 11.4* 12.6*  NEUTROABS 10.0* 11.1* 8.9* 8.4* 9.9*  HGB 14.5 13.8 12.9* 12.7* 12.8*  HCT 42.9 41.4 38.8* 38.1* 38.5*  MCV 91.3 92.8 93.5 93.2 92.3  PLT 240 259 259 243 279   Cardiac Enzymes:  Recent Labs Lab 08/28/13 2358 08/29/13 0400 08/29/13 1005  TROPONINI <0.30 <0.30 <0.30   BNP (last 3 results) No  results found for this basename: PROBNP,  in the last 8760 hours CBG: No results found for this basename: GLUCAP,  in the last 168 hours  Recent Results (from the past 240 hour(s))  URINE CULTURE     Status: None   Collection Time    08/28/13  6:39 PM      Result Value Ref Range Status   Specimen Description URINE, CATHETERIZED   Final   Special Requests NONE   Final   Culture  Setup Time     Final   Value: 08/28/2013 23:37     Performed at [facility]   Colony Count     Final   Value: NO GROWTH      Performed at [facility]   Culture     Final   Value: NO GROWTH     Performed at [facility]   Report Status 08/29/2013 FINAL   Final  MRSA PCR SCREENING     Status: None   Collection Time    08/29/13 12:02 AM      Result Value Ref Range Status   MRSA by PCR NEGATIVE  NEGATIVE Final   Comment:            The GeneXpert MRSA Assay (FDA     approved for NASAL specimens     only), is one component of a     comprehensive MRSA colonization     surveillance program. It is not     intended to diagnose MRSA     infection nor to guide or     monitor treatment for     MRSA infections.  CULTURE, BLOOD (ROUTINE X 2)     Status: None   Collection Time    08/29/13  1:10 PM      Result Value Ref Range Status   Specimen Description BLOOD LEFT ARM   Final   Special Requests BOTTLES DRAWN AEROBIC AND ANAEROBIC 10CC   Final   Culture  Setup Time     Final   Value: 08/29/2013 16:37     Performed at [facility]   Culture     Final   Value:        BLOOD CULTURE RECEIVED NO GROWTH TO DATE CULTURE WILL BE HELD FOR 5 DAYS BEFORE ISSUING A FINAL NEGATIVE REPORT     Performed at [facility]   Report Status PENDING   Incomplete  CULTURE, BLOOD (ROUTINE X 2)     Status: None   Collection Time    08/29/13  1:15 PM      Result Value Ref Range Status   Specimen Description BLOOD LEFT HAND   Final   Special Requests BOTTLES DRAWN AEROBIC ONLY 10CC   Final   Culture  Setup Time     Final   Value: 08/29/2013 16:37     Performed at [facility]   Culture     Final   Value:        BLOOD CULTURE RECEIVED NO GROWTH TO DATE CULTURE WILL BE HELD FOR 5 DAYS BEFORE ISSUING A FINAL NEGATIVE REPORT     Performed at [facility]   Report Status PENDING   Incomplete     Studies: No results found.  Scheduled Meds: . beta carotene w/minerals  1 tablet Oral Daily  . cyanocobalamin  500 mcg Oral Daily  . divalproex  375 mg Oral TID  . latanoprost  1 drop Both  Eyes QHS  .  levofloxacin  750 mg Oral Daily  . polyethylene glycol  17 g Oral BID  . QUEtiapine  50 mg Oral BID  . sodium chloride  3 mL Intravenous Q12H  . [START ON 09/03/2013] Vitamin D (Ergocalciferol)  50,000 Units Oral Q Mon   Continuous Infusions: . dextrose 5 % 1,000 mL infusion 75 mL/hr at 08/29/13 0815    Active Problems:   Encephalopathy    Time spent: 35 MINS    [facility] S MD [facility] Pager [phone]. If 7PM-7AM, please contact night-coverage at [email or web address], password [facility] 08/31/2013, 2:46 PM  LOS: 3 days

## 2013-08-31 NOTE — Progress Notes (Signed)
Speech Language Pathology Treatment: Dysphagia  Patient Details Name: Nathaniel Stone MRN: [id number] DOB: 01/25/1929 Today's Date: 08/31/2013 Time: [id number] SLP Time Calculation (min): 22 min  Assessment / Plan / Recommendation Clinical Impression  Pt. Appears to be tolerating current diet (Dysphagia 1-Puree, Nectar thick liquids) without s/s of  Aspiration.  Pt. Needs total assist with feeding, with verbal and tactile cues to follow precautions. Son was educated to need for diet modifications, aspiration precautions, and how to thicken liquids.   HPI HPI: This is a 80 y.o. year old male with prior hx/o severe dementia, agitation, HTN, remote hx/o CAD s/p stent  presenting with worsening confusion, inability to ambulate dehydration. Level V caveat 2/2 dementia. Pt's daughter and son primary historians. Pt is a nursing home resident. Family brought pt to ER 4-5 days ago for L sided hip pain with inability to ambulate. A CT hip was obtained that was negative for fracture. Per the family, there was no reported fall at the nursing home. Family states that pt has been generally lethargic over the past week, which is off of pt's baseline. Pt is non conversive, but does sing at baseline. Pt has not been doing this over past week. Today, family states that pt spiked a fever in the nursing home. Pt has also had ? Decreased appetite. No cough. No recent changes in medication. Pt alsow with generalized pain diffusely.  Pt subsequently brought back to the ER because of fever.    Pertinent Vitals n/a  SLP Plan  Continue with current plan of care    Recommendations Diet recommendations: Dysphagia 1 (puree);Nectar-thick liquid Liquids provided via: Cup;No straw Medication Administration: Crushed with puree Supervision: Staff to assist with self feeding Compensations: Slow rate;Small sips/bites;Check for pocketing Postural Changes and/or Swallow Maneuvers: Seated upright 90 degrees              Oral Care  Recommendations: Oral care BID;Staff/trained caregiver to provide oral care Follow up Recommendations: Skilled Nursing facility Plan: Continue with current plan of care    GO     Nathaniel RochesterWillis, Nathaniel Stone 08/31/2013, 4:22 PM

## 2013-08-31 NOTE — Discharge Summary (Signed)
Physician Discharge Summary  Nathaniel Stone ZOX:[id number] DOB: 12-15-1928 DOA: 08/28/2013  PCP: Lemont Fillers., NP  Admit date: 08/28/2013 Discharge date: 09/01/2013  Time spent: 50 minutes  Recommendations for Outpatient Follow-up:  1. *Follow up PCP in 2 weeks  Discharge Diagnoses:  Active Problems:   Encephalopathy Hypernatremia   Discharge Condition: Stable  Diet recommendation: Diet recommendations: Dysphagia 1 (puree);Nectar-thick liquid  Liquids provided via: Cup;No straw  Medication Administration: Crushed with puree  Supervision: Staff to assist with self feeding  Compensations: Slow rate;Small sips/bites;Check for pocketing  Postural Changes and/or Swallow Maneuvers: Seated upright 90 degrees    Filed Weights   08/28/13 2337  Weight: 55.611 kg (122 lb 9.6 oz)    History of present illness:  80 y.o. year old male with prior hx/o severe dementia, agitation, HTN, remote hx/o CAD s/p stent presenting with worsening confusion, inability to ambulate dehydration. Level V caveat 2/2 dementia. Pt's daughter and son primary historians. Pt is a nursing home resident. Family brought pt to ER 4-5 days ago for L sided hip pain with inability to ambulate. A CT hip was obtained that was negative for fracture. Per the family, there was no reported fall at the nursing home. Family states that pt has been generally lethargic over the past week, which is off of pt's baseline. Pt is non conversive, but does sing at baseline. Pt has not been doing this over past week. Today, family states that pt spiked a fever in the nursing home. Pt has also had ? Decreased appetite. No cough. No recent changes in medication. Pt alsow with generalized pain diffusely. Pt subsequently brought back to the ER because of fever.      Hospital Course:  1 Acute encephalopathy  As per RN patient's son was in the room today and as per him patient is back to baseline. Encephalopathy was likely due to dehydration,  IV antibiotics were started empirically, which was changed to Levaquin as the CXR was  Negative. Continue with Levaquin for three more days for possible pneumonia. Patient with clinical improvement.CT head was negative. CT abdomen and pelvis negative for any acute abnormalities.urinalysis is negative. Checked blood cultures x2 , which shows no growth till date. MRI of the head could not performed as patient was unable to lie still.   #2 Left hip hematoma  Warm compresses.   #3 Coronary artery disease  Stable.cardiac enzymes negative x3.   #4 Hypernatremia  Improved, sodium was 143 today, he has good by mouth intake.   #5 Dementia  Stable. Continue Depakote and Seroquel. Patient is now at baseline  Procedures:  Duplex of lower extremities negative for DVT  Consultations:  Cardiology  Discharge Exam: Filed Vitals:   08/31/13 1600  BP: 117/67  Pulse: 82  Temp: 98.2 F (36.8 C)  Resp: 20    General: Appear in no acute distress Cardiovascular: S1S2 RRR Respiratory: Clear bilaterally  Discharge Instructions  Discharge Orders   Future Orders Complete By Expires   Discharge instructions  As directed    Scheduling Instructions:     Diet recommendations: Dysphagia 1 (puree);Nectar-thick liquid Liquids provided via: Cup;No straw Medication Administration: Crushed with puree Supervision: Staff to assist with self feeding Compensations: Slow rate;Small sips/bites;Check for pocketing Postural Changes and/or Swallow Maneuvers: Seated upright 90 degrees   Increase activity slowly  As directed        Medication List         acetaminophen 500 MG tablet  Commonly known as:  TYLENOL  Take 500 mg by mouth every 6 (six) hours as needed for mild pain or fever.     beta carotene w/minerals tablet  Take 1 tablet by mouth daily. W/Lutein.     divalproex 125 MG capsule  Commonly known as:  DEPAKOTE SPRINKLE  Take 3 capsules (375 mg total) by mouth 3 (three) times daily.      fluticasone 50 MCG/ACT nasal spray  Commonly known as:  FLONASE  Place 2 sprays into the nose daily.     latanoprost 0.005 % ophthalmic solution  Commonly known as:  XALATAN  Place 1 drop into both eyes at bedtime.     levofloxacin 750 MG tablet  Commonly known as:  LEVAQUIN  Take 1 tablet (750 mg total) by mouth daily.     NUTRA SHAKE PO  Take 1 Bottle by mouth 3 (three) times daily.     polyethylene glycol packet  Commonly known as:  MIRALAX / GLYCOLAX  Take 17 g by mouth daily.     QUEtiapine 50 MG tablet  Commonly known as:  SEROQUEL  Take 50 mg by mouth 2 (two) times daily.     RESOURCE THICKENUP CLEAR Powd  As needed     vitamin B-12 500 MCG tablet  Commonly known as:  CYANOCOBALAMIN  Take 500 mcg by mouth daily.     Vitamin D (Ergocalciferol) 50000 UNITS Caps capsule  Commonly known as:  DRISDOL  Take 50,000 Units by mouth every 7 (seven) days. Mondays.       Allergies  Allergen Reactions  . Geodon [Ziprasidone Hcl] Other (See Comments)    Per nursing home MAR.   Marland Kitchen Penicillins Other (See Comments)    Per nursing home MAR.        The results of significant diagnostics from this hospitalization (including imaging, microbiology, ancillary and laboratory) are listed below for reference.    Significant Diagnostic Studies: Dg Chest 1 View  08/28/2013   CLINICAL DATA:  Weakness, confusion  EXAM: CHEST - 1 VIEW  COMPARISON:  07/12/2013  FINDINGS: Low lung volumes. Lungs are clear. Heart size and mediastinal contours are within normal limits. No effusion.  Surgical clips right upper abdomen. Visualized skeletal structures are unremarkable.  IMPRESSION: No acute cardiopulmonary disease.   Electronically Signed   By: Oley Balm M.D.   On: 08/28/2013 20:21   Dg Lumbar Spine Complete  08/28/2013   CLINICAL DATA:  Weakness, confusion  EXAM: LUMBAR SPINE - COMPLETE 4+ VIEW  COMPARISON:  CT 08/14/2008  FINDINGS: There is no evidence of lumbar spine fracture. Alignment  is normal. Mild narrowing L4-5 and moderate narrowing L5-S1 interspaces. Patchy aortic calcifications. Surgical clips right upper abdomen.  IMPRESSION: 1. Negative for fracture or other acute bone abnormality. 2. Degenerative disc disease L4-S1.   Electronically Signed   By: Oley Balm M.D.   On: 08/28/2013 20:28   Dg Hip Bilateral W/pelvis  08/24/2013   CLINICAL DATA:  Left swelling  EXAM: BILATERAL HIP WITH PELVIS - 4+ VIEW  COMPARISON:  CT 08/14/2008  FINDINGS: There is no evidence of fracture, dislocation, or diastasis. Normal alignment and mineralization. Degenerative disc disease L5-S1.  IMPRESSION: 1. Negative for fracture or other acute bone abnormality. 2. Degenerative disc disease L5-S1.   Electronically Signed   By: Oley Balm M.D.   On: 08/24/2013 19:43   Ct Head Wo Contrast  08/28/2013   CLINICAL DATA:  Generalize decreased an NG over the past 3 days. Pain left leg. Dementia.  EXAM:  CT HEAD WITHOUT CONTRAST  CT CERVICAL SPINE WITHOUT CONTRAST  TECHNIQUE: Multidetector CT imaging of the head and cervical spine was performed following the standard protocol without intravenous contrast. Multiplanar CT image reconstructions of the cervical spine were also generated.  COMPARISON:  02/27/2013.  FINDINGS: CT HEAD FINDINGS  Small subcutaneous hematoma right occipital region may be present (series 2 image 13).  No skull fracture or intracranial hemorrhage.  Global atrophy without hydrocephalus.  Small vessel disease type changes. Remote infarct anterior limb right internal capsule. No CT evidence of large acute infarct.  No intracranial mass lesion noted on this unenhanced exam.  CT CERVICAL SPINE FINDINGS  No cervical spine fracture.  Cervical spondylotic changes including transverse ligament hypertrophy. Minimal anterior slip of C5 felt to be related to degenerative changes. No abnormal prevertebral soft tissue swelling. If ligamentous injury or cord injury were of high clinical concern, MR  imaging could be obtained for further delineation.  IMPRESSION: CT HEAD :  Small subcutaneous hematoma right occipital region may be present (series 2 image 13).  No skull fracture or intracranial hemorrhage.  Global atrophy without hydrocephalus.  Small vessel disease type changes. Remote infarct anterior limb right internal capsule. No CT evidence of large acute infarct.  CT CERVICAL SPINE :  No cervical spine fracture.  Cervical spondylotic changes including transverse ligament hypertrophy. Spinal stenosis most notable C3-4 and C5-6.  Minimal anterior slip of C5 felt to be related to degenerative changes.   Electronically Signed   By: Bridgett Larsson M.D.   On: 08/28/2013 19:06   Ct Cervical Spine Wo Contrast  08/28/2013   CLINICAL DATA:  Generalize decreased an NG over the past 3 days. Pain left leg. Dementia.  EXAM: CT HEAD WITHOUT CONTRAST  CT CERVICAL SPINE WITHOUT CONTRAST  TECHNIQUE: Multidetector CT imaging of the head and cervical spine was performed following the standard protocol without intravenous contrast. Multiplanar CT image reconstructions of the cervical spine were also generated.  COMPARISON:  02/27/2013.  FINDINGS: CT HEAD FINDINGS  Small subcutaneous hematoma right occipital region may be present (series 2 image 13).  No skull fracture or intracranial hemorrhage.  Global atrophy without hydrocephalus.  Small vessel disease type changes. Remote infarct anterior limb right internal capsule. No CT evidence of large acute infarct.  No intracranial mass lesion noted on this unenhanced exam.  CT CERVICAL SPINE FINDINGS  No cervical spine fracture.  Cervical spondylotic changes including transverse ligament hypertrophy. Minimal anterior slip of C5 felt to be related to degenerative changes. No abnormal prevertebral soft tissue swelling. If ligamentous injury or cord injury were of high clinical concern, MR imaging could be obtained for further delineation.  IMPRESSION: CT HEAD :  Small subcutaneous  hematoma right occipital region may be present (series 2 image 13).  No skull fracture or intracranial hemorrhage.  Global atrophy without hydrocephalus.  Small vessel disease type changes. Remote infarct anterior limb right internal capsule. No CT evidence of large acute infarct.  CT CERVICAL SPINE :  No cervical spine fracture.  Cervical spondylotic changes including transverse ligament hypertrophy. Spinal stenosis most notable C3-4 and C5-6.  Minimal anterior slip of C5 felt to be related to degenerative changes.   Electronically Signed   By: Bridgett Larsson M.D.   On: 08/28/2013 19:06   Ct Pelvis Wo Contrast  08/24/2013   CLINICAL DATA:  Fall, left hip deformity/pain  EXAM: CT PELVIS WITHOUT CONTRAST  TECHNIQUE: Multidetector CT imaging of the pelvis was performed following the standard protocol  without intravenous contrast.  COMPARISON:  Bilateral hip radiographs dated 08/24/2013  FINDINGS: No evidence of fracture or dislocation.  Bilateral hips and visualized bony pelvis are intact.  Degenerative changes of the lower lumbar spine.  Prostate is unremarkable.  Bladder is within normal limits.  Vascular calcifications.  Small fat containing left inguinal hernia.  No pelvic ascites.  No suspicious pelvic lymphadenopathy.  IMPRESSION: No evidence of fracture or dislocation.   Electronically Signed   By: Charline Bills M.D.   On: 08/24/2013 20:58   Ct Abdomen Pelvis W Contrast  08/28/2013   CLINICAL DATA:  Severe dementia, abdominal pain, assess for diverticulitis.  EXAM: CT ABDOMEN AND PELVIS WITH CONTRAST  TECHNIQUE: Multidetector CT imaging of the abdomen and pelvis was performed using the standard protocol following bolus administration of intravenous contrast.  CONTRAST:  80mL OMNIPAQUE IOHEXOL 300 MG/ML  SOLN  COMPARISON:  CT PELVIS W/O CM dated 08/24/2013  FINDINGS: Limited view of the lung bases demonstrates dependent atelectasis and are otherwise clear though, moderately motion degraded.  Mild to  moderate intrahepatic biliary dilatation, status post cholecystectomy. The liver is otherwise unremarkable. Spleen, pancreas, adrenal glands are unremarkable.  Fluid filled nondistended small bowel. Stomach, small and large bowel are overall normal in course and caliber without inflammatory changes the sensitivity may be decreased by lack of enteric contrast and moderate patient motion. Moderate amount of stool at rectal vault. No definite diverticulosis nor diverticulitis. No intraperitoneal free fluid nor free air.  Kidneys are well located, symmetric enhancement without nephrolithiasis, hydronephrosis. 16 mm simple cyst in the interpolar region of left kidney. No definite solid masses though mild motion through this level may decrease sensitivity. Ectasia of the infrarenal aorta without aneurysm, moderate calcific atherosclerosis of the aortoiliac vessels. Prostate is not enlarged.  Moderate left fat containing inguinal hernia. Left flank/hip subcutaneous and ipsilateral gluteal muscle edema without focal fluid collection. Severe L5-S1 degenerative disc disease resulting in moderate to severe neural foraminal narrowing.  IMPRESSION: Moderate amount of stool at rectal vault without bowel obstruction or CT findings of diverticulitis.  Left flank/ is hip subcutaneous and edematous appearing left gluteal muscles, recommend direct inspection.   Electronically Signed   By: Awilda Metro   On: 08/28/2013 23:28   Dg Knee Complete 4 Views Left  08/28/2013   CLINICAL DATA:  Weakness, confusion  EXAM: LEFT KNEE - COMPLETE 4+ VIEW  COMPARISON:  None.  FINDINGS: Narrowing of the articular cartilage in the medial compartment. Chondrocalcinosis in medial and lateral compartments. Small marginal spurs from the medial femoral condyles and tibial plateau as well as patellar articular surface. Negative for fracture or dislocation. Diffuse osteopenia. No effusion.  IMPRESSION: 1. Negative for fracture or other acute bone  abnormality. 2. Tricompartmental degenerative changes with chondrocalcinosis, suggesting CPPD.   Electronically Signed   By: Oley Balm M.D.   On: 08/28/2013 20:27    Microbiology: Recent Results (from the past 240 hour(s))  URINE CULTURE     Status: None   Collection Time    08/28/13  6:39 PM      Result Value Ref Range Status   Specimen Description URINE, CATHETERIZED   Final   Special Requests NONE   Final   Culture  Setup Time     Final   Value: 08/28/2013 23:37     Performed at [facility]   Colony Count     Final   Value: NO GROWTH     Performed at [facility]  Culture     Final   Value: NO GROWTH     Performed at [facility]   Report Status 08/29/2013 FINAL   Final  MRSA PCR SCREENING     Status: None   Collection Time    08/29/13 12:02 AM      Result Value Ref Range Status   MRSA by PCR NEGATIVE  NEGATIVE Final   Comment:            The GeneXpert MRSA Assay (FDA     approved for NASAL specimens     only), is one component of a     comprehensive MRSA colonization     surveillance program. It is not     intended to diagnose MRSA     infection nor to guide or     monitor treatment for     MRSA infections.  CULTURE, BLOOD (ROUTINE X 2)     Status: None   Collection Time    08/29/13  1:10 PM      Result Value Ref Range Status   Specimen Description BLOOD LEFT ARM   Final   Special Requests BOTTLES DRAWN AEROBIC AND ANAEROBIC 10CC   Final   Culture  Setup Time     Final   Value: 08/29/2013 16:37     Performed at [facility]   Culture     Final   Value:        BLOOD CULTURE RECEIVED NO GROWTH TO DATE CULTURE WILL BE HELD FOR 5 DAYS BEFORE ISSUING A FINAL NEGATIVE REPORT     Performed at [facility]   Report Status PENDING   Incomplete  CULTURE, BLOOD (ROUTINE X 2)     Status: None   Collection Time    08/29/13  1:15 PM      Result Value Ref Range Status   Specimen Description BLOOD LEFT HAND   Final   Special  Requests BOTTLES DRAWN AEROBIC ONLY 10CC   Final   Culture  Setup Time     Final   Value: 08/29/2013 16:37     Performed at [facility]   Culture     Final   Value:        BLOOD CULTURE RECEIVED NO GROWTH TO DATE CULTURE WILL BE HELD FOR 5 DAYS BEFORE ISSUING A FINAL NEGATIVE REPORT     Performed at [facility]   Report Status PENDING   Incomplete     Labs: Basic Metabolic Panel:  Recent Labs Lab 08/24/13 1952 08/28/13 1836 08/29/13 0400 08/30/13 0359 08/31/13 0440  NA 140 154* 154* 150* 143  K 4.5 3.9 3.7 4.2 4.4  CL 101 115* 115* 113* 106  CO2 24 26 27 27 26   GLUCOSE 114* 98 84 99 112*  BUN 31* 33* 25* 21 15  CREATININE 1.02 0.79 0.63 0.76 0.71  CALCIUM 9.7 9.6 9.2 9.3 9.2   Liver Function Tests:  Recent Labs Lab 08/28/13 1836 08/29/13 0400 08/30/13 0359 08/31/13 0440  AST 23 20 16  36  ALT 14 11 10 13   ALKPHOS 55 52 52 50  BILITOT 0.7 0.7 0.3 0.3  PROT 6.8 6.0 5.9* 6.1  ALBUMIN 2.3* 2.0* 1.9* 1.8*   No results found for this basename: LIPASE, AMYLASE,  in the last 168 hours No results found for this basename: AMMONIA,  in the last 168 hours CBC:  Recent Labs Lab 08/24/13 1952 08/28/13 1836 08/29/13 0400 08/30/13 0359 08/31/13 0440  WBC  13.0* 13.9* 11.3* 11.4* 12.6*  NEUTROABS 10.0* 11.1* 8.9* 8.4* 9.9*  HGB 14.5 13.8 12.9* 12.7* 12.8*  HCT 42.9 41.4 38.8* 38.1* 38.5*  MCV 91.3 92.8 93.5 93.2 92.3  PLT 240 259 259 243 279   Cardiac Enzymes:  Recent Labs Lab 08/28/13 2358 08/29/13 0400 08/29/13 1005  TROPONINI <0.30 <0.30 <0.30   BNP: BNP (last 3 results) No results found for this basename: PROBNP,  in the last 8760 hours CBG: No results found for this basename: GLUCAP,  in the last 168 hours     Signed:  Kelten Enochs S  [facility] 08/31/2013, 5:17 PM

## 2013-08-31 NOTE — Progress Notes (Signed)
Physical Therapy Treatment Patient Details Name: Nathaniel Stone MRN: [id number] DOB: 04/18/1929 Today's Date: 08/31/2013 Time: [id number] PT Time Calculation (min): 29 min  PT Assessment / Plan / Recommendation  History of Present Illness This is a 80 y.o. year old male with prior hx/o severe dementia, agitation, HTN, remote hx/o CAD s/p stent  presenting with worsening confusion, inability to ambulate dehydration. Level V caveat 2/2 dementia. Pt's daughter and son primary historians. Pt is a nursing home resident. Family brought pt to ER 4-5 days ago for L sided hip pain with inability to ambulate. A CT hip was obtained that was negative for fracture. Per the family, there was no reported fall at the nursing home. Family states that pt has been generally lethargic over the past week, which is off of pt's baseline. Pt is non conversive, but does sing at baseline. Pt has not been doing this over past week. Today, family states that pt spiked a fever in the nursing home. Pt has also had ? Decreased appetite. No cough. No recent changes in medication. Pt alsow with generalized pain diffusely.  Pt subsequently brought back to the ER because of fever.    PT Comments   Patient highly agitated this session with movement. Unable to sit EOB. Did assist with rolling x2 assist for pericare as patient very soiled in bed. Positioned on R side for comfort. Continue to recommend SNF for ongoing Physical Therapy.     Follow Up Recommendations  SNF     Does the patient have the potential to tolerate intense rehabilitation     Barriers to Discharge        Equipment Recommendations  None recommended by PT    Recommendations for Other Services    Frequency Min 2X/week   Progress towards PT Goals Progress towards PT goals: Progressing toward goals  Plan Current plan remains appropriate    Precautions / Restrictions Precautions Precautions: Fall Precaution Comments: dementia   Pertinent Vitals/Pain no  apparent distress     Mobility  Bed Mobility Bed Mobility: Rolling Rolling: +2 for physical assistance General bed mobility comments: Patient unable to assist. Fighting and swinging arms throughout. Attempted to sit EOB and unable    Exercises     PT Diagnosis:    PT Problem List:   PT Treatment Interventions:     PT Goals (current goals can now be found in the care plan section)    Visit Information  Last PT Received On: 08/31/13 Assistance Needed: +2 History of Present Illness: This is a 80 y.o. year old male with prior hx/o severe dementia, agitation, HTN, remote hx/o CAD s/p stent  presenting with worsening confusion, inability to ambulate dehydration. Level V caveat 2/2 dementia. Pt's daughter and son primary historians. Pt is a nursing home resident. Family brought pt to ER 4-5 days ago for L sided hip pain with inability to ambulate. A CT hip was obtained that was negative for fracture. Per the family, there was no reported fall at the nursing home. Family states that pt has been generally lethargic over the past week, which is off of pt's baseline. Pt is non conversive, but does sing at baseline. Pt has not been doing this over past week. Today, family states that pt spiked a fever in the nursing home. Pt has also had ? Decreased appetite. No cough. No recent changes in medication. Pt alsow with generalized pain diffusely.  Pt subsequently brought back to the ER because of fever.  Subjective Data      Cognition  Cognition Arousal/Alertness: Awake/alert Behavior During Therapy: Restless Overall Cognitive Status: History of cognitive impairments - at baseline    Balance     End of Session PT - End of Session Activity Tolerance: Treatment limited secondary to agitation Patient left: in bed;with call bell/phone within reach Nurse Communication: Mobility status   GP     Fredrich Birks 08/31/2013, 2:37 PM 08/31/2013 Fredrich Birks PTA [phone]  pager [phone] office

## 2013-08-31 NOTE — Progress Notes (Signed)
CSW Proofreader(Clinical Social Worker) spoke with pt son and [facility] CSW confirmed with facility they are able to accept a weekend discharge. MD is aware.  Karder Goodin, LCSWA [phone]

## 2013-09-01 ENCOUNTER — Telehealth: Payer: Self-pay | Admitting: Family

## 2013-09-01 DIAGNOSIS — R0789 Other chest pain: Secondary | ICD-10-CM

## 2013-09-01 DIAGNOSIS — I2510 Atherosclerotic heart disease of native coronary artery without angina pectoris: Secondary | ICD-10-CM

## 2013-09-01 DIAGNOSIS — F4320 Adjustment disorder, unspecified: Secondary | ICD-10-CM

## 2013-09-01 LAB — COMPREHENSIVE METABOLIC PANEL
ALT: 11 U/L (ref 0–53)
AST: 24 U/L (ref 0–37)
Albumin: 1.6 g/dL — ABNORMAL LOW (ref 3.5–5.2)
Alkaline Phosphatase: 46 U/L (ref 39–117)
BUN: 12 mg/dL (ref 6–23)
CALCIUM: 8.7 mg/dL (ref 8.4–10.5)
CO2: 26 mEq/L (ref 19–32)
Chloride: 105 mEq/L (ref 96–112)
Creatinine, Ser: 0.67 mg/dL (ref 0.50–1.35)
GFR calc Af Amer: >90 mL/min (ref >90)
GFR calc non Af Amer: 87 mL/min — ABNORMAL LOW (ref >90)
GLUCOSE: 94 mg/dL (ref 70–99)
Potassium: 4.4 mEq/L (ref 3.7–5.3)
SODIUM: 140 meq/L (ref 137–147)
TOTAL PROTEIN: 5.6 g/dL — AB (ref 6.0–8.3)
Total Bilirubin: <0.2 mg/dL — ABNORMAL LOW (ref 0.3–1.2)

## 2013-09-01 LAB — CBC WITH DIFFERENTIAL/PLATELET
Basophils Absolute: 0 10*3/uL (ref 0.0–0.1)
Basophils Relative: 0 % (ref 0–1)
Eosinophils Absolute: 0.1 10*3/uL (ref 0.0–0.7)
Eosinophils Relative: 1 % (ref 0–5)
HEMATOCRIT: 34.3 % — AB (ref 39.0–52.0)
HEMOGLOBIN: 11.4 g/dL — AB (ref 13.0–17.0)
LYMPHS ABS: 1.4 10*3/uL (ref 0.7–4.0)
LYMPHS PCT: 14 % (ref 12–46)
MCH: 30.6 pg (ref 26.0–34.0)
MCHC: 33.2 g/dL (ref 30.0–36.0)
MCV: 92.2 fL (ref 78.0–100.0)
MONO ABS: 1.1 10*3/uL — AB (ref 0.1–1.0)
MONOS PCT: 11 % (ref 3–12)
NEUTROS ABS: 7.6 10*3/uL (ref 1.7–7.7)
Neutrophils Relative %: 74 % (ref 43–77)
Platelets: 251 10*3/uL (ref 150–400)
RBC: 3.72 MIL/uL — ABNORMAL LOW (ref 4.22–5.81)
RDW: 13.4 % (ref 11.5–15.5)
WBC: 10.3 10*3/uL (ref 4.0–10.5)

## 2013-09-01 NOTE — Telephone Encounter (Signed)
Please call son and arrange a 2 week hospital follow up.

## 2013-09-01 NOTE — Clinical Social Work Placement (Signed)
Clinical Social Work Department CLINICAL SOCIAL WORK PLACEMENT NOTE 09/01/2013  Patient:  Nathaniel Stone,Nathaniel Stone  Account Number:  [id number] Admit date:  08/28/2013  Clinical Social Worker:  Cherre BlancJOSEPH BRYANT Japji Kok, [place]  Date/time:  09/01/2013 04:04 PM  Clinical Social Work is seeking post-discharge placement for this patient at the following level of care:   SKILLED NURSING   (*CSW will update this form in Epic as items are completed)   09/01/2013  Patient/family provided with [facility] of Clinical Social Work's list of facilities offering this level of care within the geographic area requested by the patient (or if unable, by the patient's family).  09/01/2013  Patient/family informed of their freedom to choose among providers that offer the needed level of care, that participate in Medicare, Medicaid or managed care program needed by the patient, have an available bed and are willing to accept the patient.  09/01/2013  Patient/family informed of [facility]' ownership interest in [facility], as well as of the fact that they are under no obligation to receive care at this facility.  PASARR submitted to EDS on  PASARR number received from EDS on   FL2 transmitted to all facilities in geographic area requested by pt/family on  09/01/2013 FL2 transmitted to all facilities within larger geographic area on   Patient informed that his/her managed care company has contracts with or will negotiate with  certain facilities, including the following:     Patient/family informed of bed offers received:  09/01/2013 [facility] Physician recommends and patient chooses bed at    [facility]  09/01/2013 Patient to be transferred to facility by Ambulance  The following physician request were entered in Epic:   Additional Comments: Per MD [facility] RN, patient's son  Gerda DissDwayne, and facility notified of DC. RN given number for report. DC packet on chart. Ambulance transport requested for patient. CSW signing off.   Roddie McBryant Denell Cothern, [place], [place], [phone]

## 2013-09-01 NOTE — Clinical Social Work Note (Signed)
CSW requested ambulance transport for patient for 2:30PM. RN and son [name] aware. Full placement note to come.   Roddie McBryant Shterna Laramee, [place], [place], [phone]

## 2013-09-01 NOTE — Progress Notes (Signed)
Patient ID: Nathaniel Stone  male  AGT:[id number]    DOB: [birth year]    DOA: 08/28/2013  PCP: Nance Pear., NP  Assessment/Plan:  #1 acute encephalopathy  - Patient has significant dementia advanced. Met with the son in the room, per him, patient is close to his baseline. Continue Levaquin. - Patient with clinical improvement.CT head was negative. CT abdomen and pelvis negative for any acute abnormalities.urinalysis is negative. blood cultures x2 negative so far. MRI of the head could not performed as patient was unable to lie still.   #2 left hip hematoma Warm compresses. Follow.   #3 Coronary artery disease Stable.cardiac enzymes negative x3.   #4 Hypernatremia improved to 140, has good oral intake with assistance, fluids have been discontinued.  #5 Dementia  Stable. Continue Depakote and Seroquel.     Code Status: full code  Family Communication: discussed in detail with the patient's son at the bedside  Disposition: to skilled nursing facility. Please note that the [facility] ON 08/31/13, NO CHANGES.    Subjective:  Patient is eating breakfast with assistance, intermittently singing, at baseline per the son at the bedside no acute issues overnight  Objective: Weight change:   Intake/Output Summary (Last 24 hours) at 09/01/13 1014 Last data filed at 09/01/13 0818  Gross per 24 hour  Intake    300 ml  Output   1681 ml  Net  -1381 ml   Blood pressure 96/47, pulse 98, temperature 98.3 F (36.8 C), temperature source Axillary, resp. rate 20, height $RemoveBe'5\' 7"'[id number]$  (1.702 m), weight 55.611 kg (122 lb 9.6 oz), SpO2 94.00%.  Physical Exam: General: Alert and awake nad CVS: S1-S2 clear, no murmur rubs or gallops Chest: clear to auscultation bilaterally, no wheezing, rales or rhonchi Abdomen: soft nontender, nondistended, normal bowel sounds  Extremities: no cyanosis, clubbing or edema noted bilaterally   Lab Results: Basic Metabolic Panel:  Recent  Labs Lab 08/31/13 0440 09/01/13 0525  NA 143 140  K 4.4 4.4  CL 106 105  CO2 26 26  GLUCOSE 112* 94  BUN 15 12  CREATININE 0.71 0.67  CALCIUM 9.2 8.7   Liver Function Tests:  Recent Labs Lab 08/31/13 0440 09/01/13 0525  AST 36 24  ALT 13 11  ALKPHOS 50 46  BILITOT 0.3 <0.2*  PROT 6.1 5.6*  ALBUMIN 1.8* 1.6*   No results found for this basename: LIPASE, AMYLASE,  in the last 168 hours No results found for this basename: AMMONIA,  in the last 168 hours CBC:  Recent Labs Lab 08/31/13 0440 09/01/13 0525  WBC 12.6* 10.3  NEUTROABS 9.9* 7.6  HGB 12.8* 11.4*  HCT 38.5* 34.3*  MCV 92.3 92.2  PLT 279 251   Cardiac Enzymes:  Recent Labs Lab 08/28/13 2358 08/29/13 0400 08/29/13 1005  TROPONINI <0.30 <0.30 <0.30   BNP: No components found with this basename: POCBNP,  CBG: No results found for this basename: GLUCAP,  in the last 168 hours   Micro Results: Recent Results (from the past 240 hour(s))  URINE CULTURE     Status: None   Collection Time    08/28/13  6:39 PM      Result Value Ref Range Status   Specimen Description URINE, CATHETERIZED   Final   Special Requests NONE   Final   Culture  Setup Time     Final   Value: 08/28/2013 23:37     Performed at [place]  Final   Value: NO GROWTH     Performed at [facility]   Culture     Final   Value: NO GROWTH     Performed at [facility]   Report Status 08/29/2013 FINAL   Final  MRSA PCR SCREENING     Status: None   Collection Time    08/29/13 12:02 AM      Result Value Ref Range Status   MRSA by PCR NEGATIVE  NEGATIVE Final   Comment:            The GeneXpert MRSA Assay (FDA     approved for NASAL specimens     only), is one component of a     comprehensive MRSA colonization     surveillance program. It is not     intended to diagnose MRSA     infection nor to guide or     monitor treatment for     MRSA infections.  CULTURE, BLOOD (ROUTINE X 2)      Status: None   Collection Time    08/29/13  1:10 PM      Result Value Ref Range Status   Specimen Description BLOOD LEFT ARM   Final   Special Requests BOTTLES DRAWN AEROBIC AND ANAEROBIC 10CC   Final   Culture  Setup Time     Final   Value: 08/29/2013 16:37     Performed at [facility]   Culture     Final   Value:        BLOOD CULTURE RECEIVED NO GROWTH TO DATE CULTURE WILL BE HELD FOR 5 DAYS BEFORE ISSUING A FINAL NEGATIVE REPORT     Performed at [facility]   Report Status PENDING   Incomplete  CULTURE, BLOOD (ROUTINE X 2)     Status: None   Collection Time    08/29/13  1:15 PM      Result Value Ref Range Status   Specimen Description BLOOD LEFT HAND   Final   Special Requests BOTTLES DRAWN AEROBIC ONLY 10CC   Final   Culture  Setup Time     Final   Value: 08/29/2013 16:37     Performed at [facility]   Culture     Final   Value:        BLOOD CULTURE RECEIVED NO GROWTH TO DATE CULTURE WILL BE HELD FOR 5 DAYS BEFORE ISSUING A FINAL NEGATIVE REPORT     Performed at [facility]   Report Status PENDING   Incomplete    Studies/Results: Dg Chest 1 View  08/28/2013   CLINICAL DATA:  Weakness, confusion  EXAM: CHEST - 1 VIEW  COMPARISON:  07/12/2013  FINDINGS: Low lung volumes. Lungs are clear. Heart size and mediastinal contours are within normal limits. No effusion.  Surgical clips right upper abdomen. Visualized skeletal structures are unremarkable.  IMPRESSION: No acute cardiopulmonary disease.   Electronically Signed   By: Arne Cleveland M.D.   On: 08/28/2013 20:21   Dg Lumbar Spine Complete  08/28/2013   CLINICAL DATA:  Weakness, confusion  EXAM: LUMBAR SPINE - COMPLETE 4+ VIEW  COMPARISON:  CT 08/14/2008  FINDINGS: There is no evidence of lumbar spine fracture. Alignment is normal. Mild narrowing L4-5 and moderate narrowing L5-S1 interspaces. Patchy aortic calcifications. Surgical clips right upper abdomen.  IMPRESSION: 1. Negative for fracture  or other acute bone abnormality. 2. Degenerative disc disease L4-S1.   Electronically Signed   By:  Arne Cleveland M.D.   On: 08/28/2013 20:28   Dg Hip Bilateral W/pelvis  08/24/2013   CLINICAL DATA:  Left swelling  EXAM: BILATERAL HIP WITH PELVIS - 4+ VIEW  COMPARISON:  CT 08/14/2008  FINDINGS: There is no evidence of fracture, dislocation, or diastasis. Normal alignment and mineralization. Degenerative disc disease L5-S1.  IMPRESSION: 1. Negative for fracture or other acute bone abnormality. 2. Degenerative disc disease L5-S1.   Electronically Signed   By: Arne Cleveland M.D.   On: 08/24/2013 19:43   Ct Head Wo Contrast  08/28/2013   CLINICAL DATA:  Generalize decreased an NG over the past 3 days. Pain left leg. Dementia.  EXAM: CT HEAD WITHOUT CONTRAST  CT CERVICAL SPINE WITHOUT CONTRAST  TECHNIQUE: Multidetector CT imaging of the head and cervical spine was performed following the standard protocol without intravenous contrast. Multiplanar CT image reconstructions of the cervical spine were also generated.  COMPARISON:  02/27/2013.  FINDINGS: CT HEAD FINDINGS  Small subcutaneous hematoma right occipital region may be present (series 2 image 13).  No skull fracture or intracranial hemorrhage.  Global atrophy without hydrocephalus.  Small vessel disease type changes. Remote infarct anterior limb right internal capsule. No CT evidence of large acute infarct.  No intracranial mass lesion noted on this unenhanced exam.  CT CERVICAL SPINE FINDINGS  No cervical spine fracture.  Cervical spondylotic changes including transverse ligament hypertrophy. Minimal anterior slip of C5 felt to be related to degenerative changes. No abnormal prevertebral soft tissue swelling. If ligamentous injury or cord injury were of high clinical concern, MR imaging could be obtained for further delineation.  IMPRESSION: CT HEAD :  Small subcutaneous hematoma right occipital region may be present (series 2 image 13).  No skull fracture  or intracranial hemorrhage.  Global atrophy without hydrocephalus.  Small vessel disease type changes. Remote infarct anterior limb right internal capsule. No CT evidence of large acute infarct.  CT CERVICAL SPINE :  No cervical spine fracture.  Cervical spondylotic changes including transverse ligament hypertrophy. Spinal stenosis most notable C3-4 and C5-6.  Minimal anterior slip of C5 felt to be related to degenerative changes.   Electronically Signed   By: Chauncey Cruel M.D.   On: 08/28/2013 19:06   Ct Cervical Spine Wo Contrast  08/28/2013   CLINICAL DATA:  Generalize decreased an NG over the past 3 days. Pain left leg. Dementia.  EXAM: CT HEAD WITHOUT CONTRAST  CT CERVICAL SPINE WITHOUT CONTRAST  TECHNIQUE: Multidetector CT imaging of the head and cervical spine was performed following the standard protocol without intravenous contrast. Multiplanar CT image reconstructions of the cervical spine were also generated.  COMPARISON:  02/27/2013.  FINDINGS: CT HEAD FINDINGS  Small subcutaneous hematoma right occipital region may be present (series 2 image 13).  No skull fracture or intracranial hemorrhage.  Global atrophy without hydrocephalus.  Small vessel disease type changes. Remote infarct anterior limb right internal capsule. No CT evidence of large acute infarct.  No intracranial mass lesion noted on this unenhanced exam.  CT CERVICAL SPINE FINDINGS  No cervical spine fracture.  Cervical spondylotic changes including transverse ligament hypertrophy. Minimal anterior slip of C5 felt to be related to degenerative changes. No abnormal prevertebral soft tissue swelling. If ligamentous injury or cord injury were of high clinical concern, MR imaging could be obtained for further delineation.  IMPRESSION: CT HEAD :  Small subcutaneous hematoma right occipital region may be present (series 2 image 13).  No skull fracture or intracranial hemorrhage.  Global atrophy without hydrocephalus.  Small vessel disease type  changes. Remote infarct anterior limb right internal capsule. No CT evidence of large acute infarct.  CT CERVICAL SPINE :  No cervical spine fracture.  Cervical spondylotic changes including transverse ligament hypertrophy. Spinal stenosis most notable C3-4 and C5-6.  Minimal anterior slip of C5 felt to be related to degenerative changes.   Electronically Signed   By: Bridgett Larsson M.D.   On: 08/28/2013 19:06   Ct Pelvis Wo Contrast  08/24/2013   CLINICAL DATA:  Fall, left hip deformity/pain  EXAM: CT PELVIS WITHOUT CONTRAST  TECHNIQUE: Multidetector CT imaging of the pelvis was performed following the standard protocol without intravenous contrast.  COMPARISON:  Bilateral hip radiographs dated 08/24/2013  FINDINGS: No evidence of fracture or dislocation.  Bilateral hips and visualized bony pelvis are intact.  Degenerative changes of the lower lumbar spine.  Prostate is unremarkable.  Bladder is within normal limits.  Vascular calcifications.  Small fat containing left inguinal hernia.  No pelvic ascites.  No suspicious pelvic lymphadenopathy.  IMPRESSION: No evidence of fracture or dislocation.   Electronically Signed   By: Charline Bills M.D.   On: 08/24/2013 20:58   Ct Abdomen Pelvis W Contrast  08/28/2013   CLINICAL DATA:  Severe dementia, abdominal pain, assess for diverticulitis.  EXAM: CT ABDOMEN AND PELVIS WITH CONTRAST  TECHNIQUE: Multidetector CT imaging of the abdomen and pelvis was performed using the standard protocol following bolus administration of intravenous contrast.  CONTRAST:  65mL OMNIPAQUE IOHEXOL 300 MG/ML  SOLN  COMPARISON:  CT PELVIS W/O CM dated 08/24/2013  FINDINGS: Limited view of the lung bases demonstrates dependent atelectasis and are otherwise clear though, moderately motion degraded.  Mild to moderate intrahepatic biliary dilatation, status post cholecystectomy. The liver is otherwise unremarkable. Spleen, pancreas, adrenal glands are unremarkable.  Fluid filled nondistended  small bowel. Stomach, small and large bowel are overall normal in course and caliber without inflammatory changes the sensitivity may be decreased by lack of enteric contrast and moderate patient motion. Moderate amount of stool at rectal vault. No definite diverticulosis nor diverticulitis. No intraperitoneal free fluid nor free air.  Kidneys are well located, symmetric enhancement without nephrolithiasis, hydronephrosis. 16 mm simple cyst in the interpolar region of left kidney. No definite solid masses though mild motion through this level may decrease sensitivity. Ectasia of the infrarenal aorta without aneurysm, moderate calcific atherosclerosis of the aortoiliac vessels. Prostate is not enlarged.  Moderate left fat containing inguinal hernia. Left flank/hip subcutaneous and ipsilateral gluteal muscle edema without focal fluid collection. Severe L5-S1 degenerative disc disease resulting in moderate to severe neural foraminal narrowing.  IMPRESSION: Moderate amount of stool at rectal vault without bowel obstruction or CT findings of diverticulitis.  Left flank/ is hip subcutaneous and edematous appearing left gluteal muscles, recommend direct inspection.   Electronically Signed   By: Awilda Metro   On: 08/28/2013 23:28   Dg Knee Complete 4 Views Left  08/28/2013   CLINICAL DATA:  Weakness, confusion  EXAM: LEFT KNEE - COMPLETE 4+ VIEW  COMPARISON:  None.  FINDINGS: Narrowing of the articular cartilage in the medial compartment. Chondrocalcinosis in medial and lateral compartments. Small marginal spurs from the medial femoral condyles and tibial plateau as well as patellar articular surface. Negative for fracture or dislocation. Diffuse osteopenia. No effusion.  IMPRESSION: 1. Negative for fracture or other acute bone abnormality. 2. Tricompartmental degenerative changes with chondrocalcinosis, suggesting CPPD.   Electronically Signed   By: Reuel Boom  Vernard Gambles M.D.   On: 08/28/2013 20:27     Medications: Scheduled Meds: . beta carotene w/minerals  1 tablet Oral Daily  . cyanocobalamin  500 mcg Oral Daily  . divalproex  375 mg Oral TID  . latanoprost  1 drop Both Eyes QHS  . levofloxacin  750 mg Oral Daily  . polyethylene glycol  17 g Oral BID  . QUEtiapine  50 mg Oral BID  . sodium chloride  3 mL Intravenous Q12H  . [START ON 09/03/2013] Vitamin D (Ergocalciferol)  50,000 Units Oral Q Mon      LOS: 4 days   Rossie Bretado M.D. [facility] 09/01/2013, 10:14 AM Pager: [phone]  If 7PM-7AM, please contact night-coverage [email or web address] Password [email or web address]

## 2013-09-03 NOTE — Telephone Encounter (Signed)
Left message for patient to return my call.

## 2013-09-04 LAB — CULTURE, BLOOD (ROUTINE X 2)
CULTURE: NO GROWTH
Culture: NO GROWTH

## 2013-09-05 NOTE — Telephone Encounter (Signed)
Notified pt's son, Rod and he voices understanding. Transferred him to front office to arrange hospital follow up.

## 2013-09-05 NOTE — Telephone Encounter (Signed)
Patient son Nathaniel Stone states that [facility]. Nathaniel Stone states that when patient is done with rehab then [facility]. Nathaniel Stone wants to know [facility] for [facility] and if we could change the date to tomorrow, 09/05/13? He states he will pick up form tomorrow.

## 2013-09-05 NOTE — Telephone Encounter (Signed)
I will need to evaluate him in the office before I can update his FL2 at this point.

## 2013-10-01 ENCOUNTER — Ambulatory Visit: Payer: Medicare Other | Admitting: Family

## 2013-10-01 DIAGNOSIS — Z0289 Encounter for other administrative examinations: Secondary | ICD-10-CM

## 2013-11-26 DEATH — deceased

## 2015-07-18 IMAGING — CR DG KNEE COMPLETE 4+V*L*
4 series · 4 of 4 positions shown · non-contrast
Comparison: None.

CLINICAL DATA: Weakness, confusion

EXAM:
LEFT KNEE - COMPLETE 4+ VIEW

[t knee oblique left (1 of 2)]
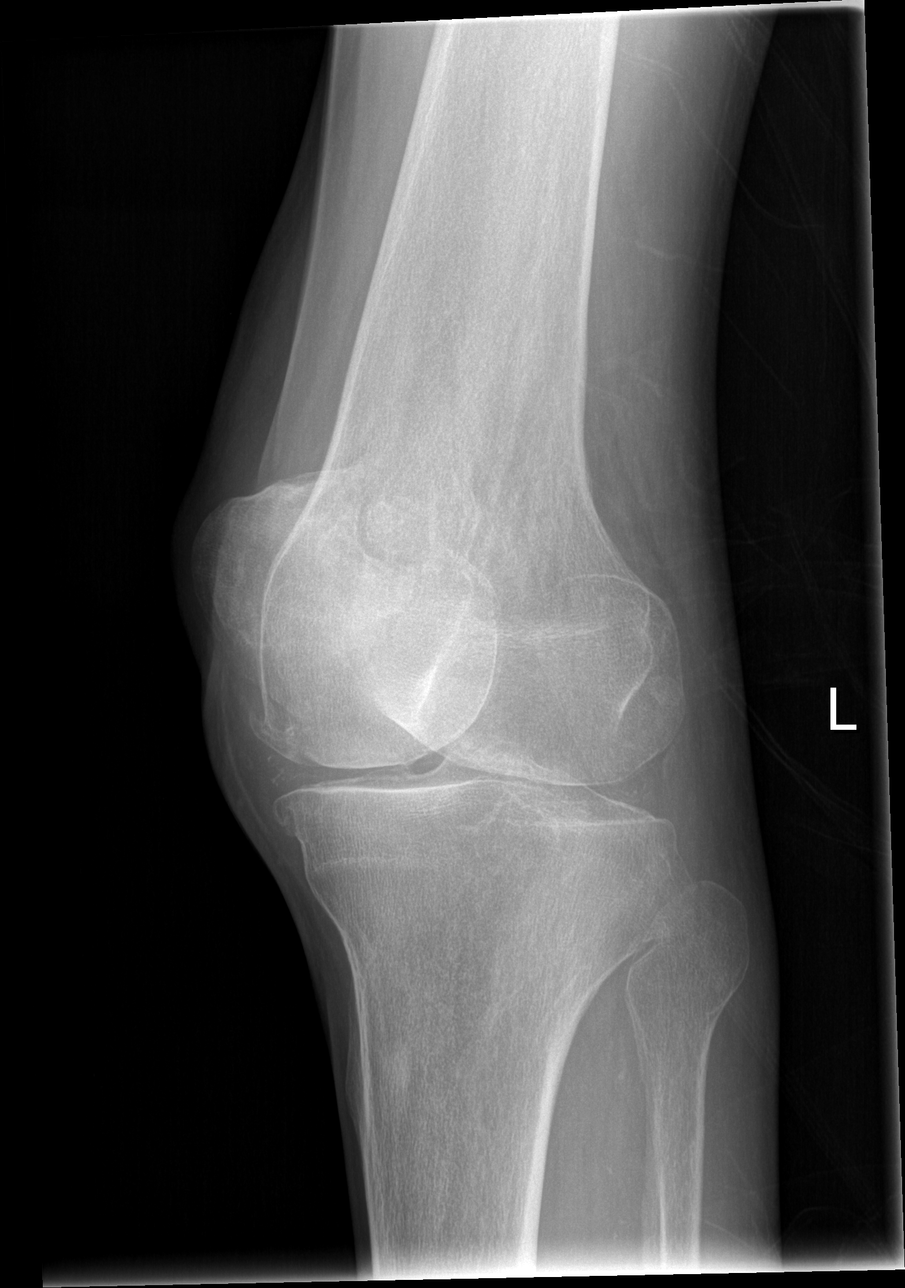

[t knee ap left]
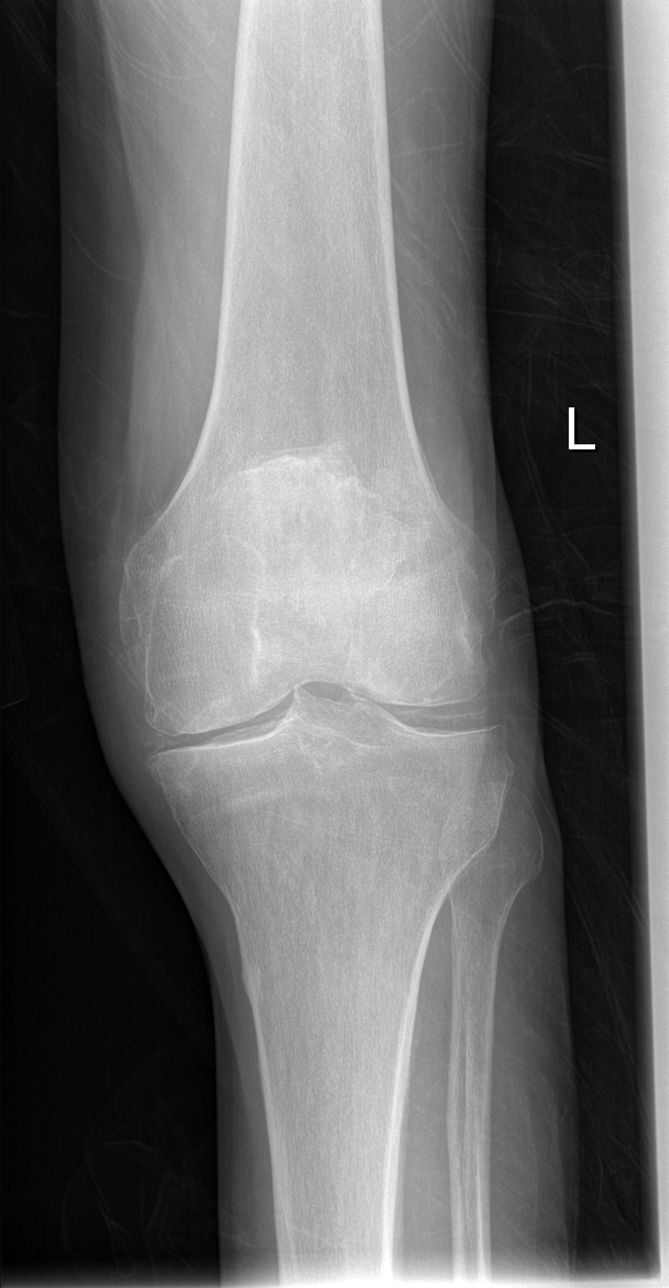

[t knee oblique left (2 of 2)]
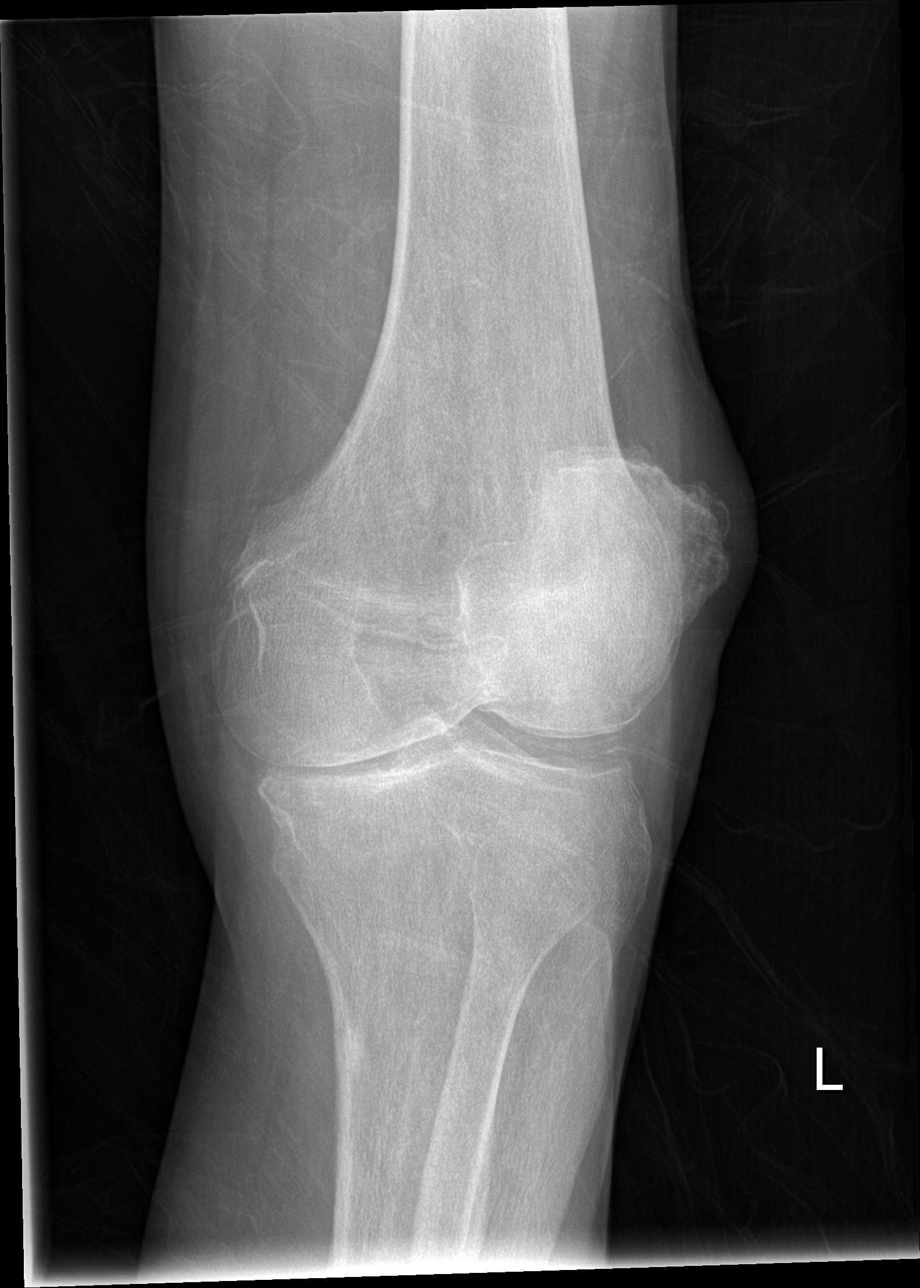

[t knee lat left]
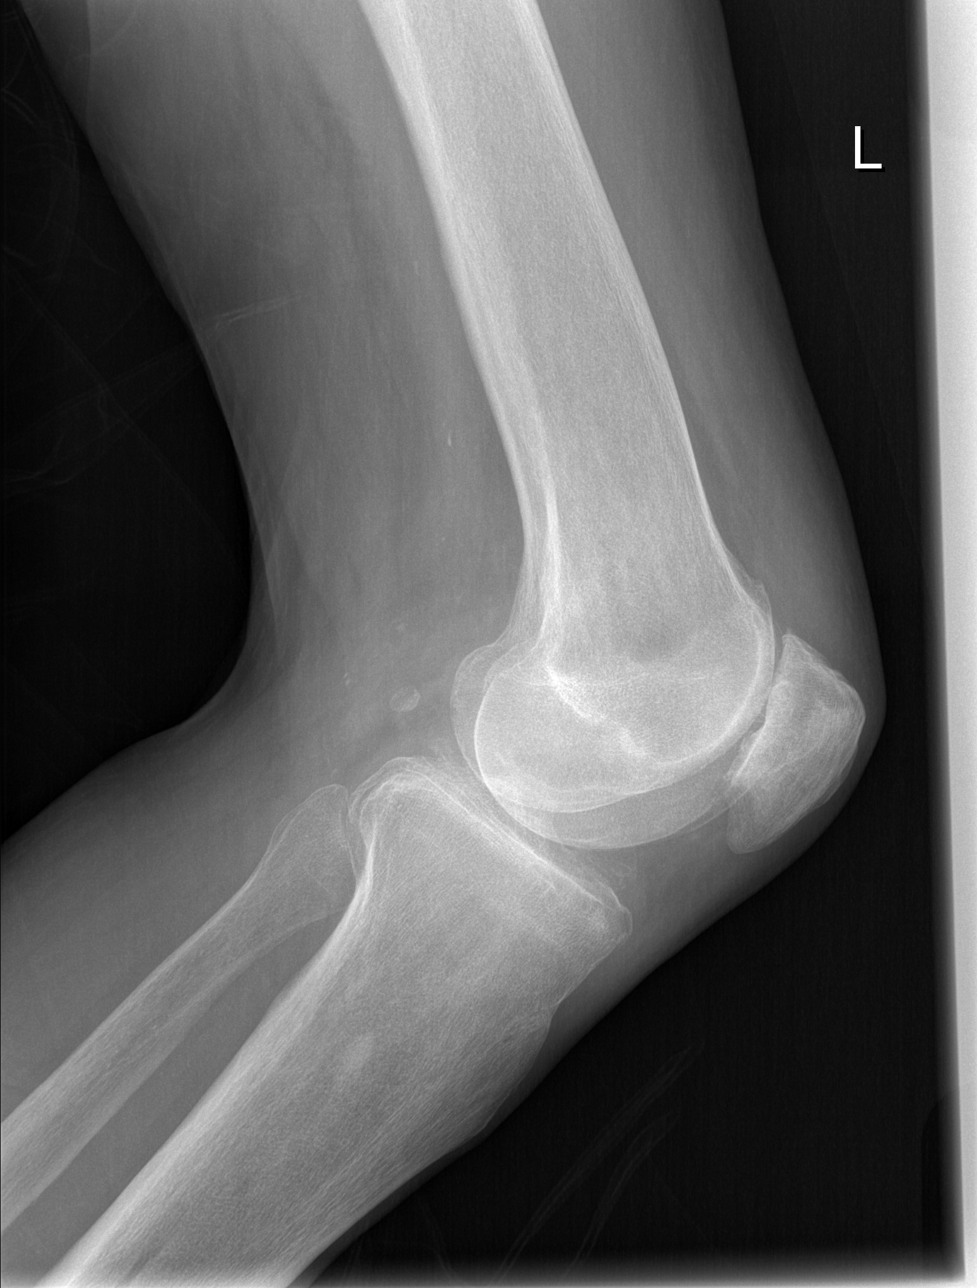

[4 of 4 positions shown; findings below may reference images not displayed]

FINDINGS: Narrowing of the articular cartilage in the medial compartment.
Chondrocalcinosis in medial and lateral compartments. Small marginal
spurs from the medial femoral condyles and tibial plateau as well as
patellar articular surface. Negative for fracture or dislocation.
Diffuse osteopenia. No effusion.
IMPRESSION: 1. Negative for fracture or other acute bone abnormality.
2. Tricompartmental degenerative changes with chondrocalcinosis,
suggesting CPPD.
